# Patient Record
Sex: Female | Born: 1970 | Race: Black or African American | Hispanic: No | Marital: Married | Smoking: Never smoker
Health system: Southern US, Community
[De-identification: ages and names within clinical notes are randomized; demographics above are authoritative.]

## PROBLEM LIST (undated history)

## (undated) DIAGNOSIS — I10 Essential (primary) hypertension: Secondary | ICD-10-CM

## (undated) DIAGNOSIS — N393 Stress incontinence (female) (male): Secondary | ICD-10-CM

## (undated) DIAGNOSIS — K219 Gastro-esophageal reflux disease without esophagitis: Secondary | ICD-10-CM

## (undated) HISTORY — PX: NASAL SINUS SURGERY: SHX719

---

## 1999-07-14 ENCOUNTER — Other Ambulatory Visit: Admission: RE | Admit: 1999-07-14 | Discharge: 1999-07-14 | Payer: Self-pay | Admitting: *Deleted

## 2000-11-08 ENCOUNTER — Other Ambulatory Visit: Admission: RE | Admit: 2000-11-08 | Discharge: 2000-11-08 | Payer: Self-pay | Admitting: Internal Medicine

## 2002-02-16 HISTORY — PX: TUBAL LIGATION: SHX77

## 2002-04-06 ENCOUNTER — Other Ambulatory Visit: Admission: RE | Admit: 2002-04-06 | Discharge: 2002-04-06 | Payer: Self-pay | Admitting: Obstetrics and Gynecology

## 2002-04-06 ENCOUNTER — Other Ambulatory Visit: Admission: RE | Admit: 2002-04-06 | Discharge: 2002-04-06 | Payer: Self-pay | Admitting: *Deleted

## 2002-07-13 ENCOUNTER — Encounter: Payer: Self-pay | Admitting: Obstetrics and Gynecology

## 2002-07-13 ENCOUNTER — Ambulatory Visit (HOSPITAL_COMMUNITY): Admission: RE | Admit: 2002-07-13 | Discharge: 2002-07-13 | Payer: Self-pay | Admitting: Obstetrics and Gynecology

## 2002-08-31 ENCOUNTER — Ambulatory Visit (HOSPITAL_COMMUNITY): Admission: RE | Admit: 2002-08-31 | Discharge: 2002-08-31 | Payer: Self-pay | Admitting: Obstetrics and Gynecology

## 2002-08-31 ENCOUNTER — Encounter: Payer: Self-pay | Admitting: Obstetrics and Gynecology

## 2002-11-11 ENCOUNTER — Inpatient Hospital Stay (HOSPITAL_COMMUNITY): Admission: AD | Admit: 2002-11-11 | Discharge: 2002-11-13 | Payer: Self-pay | Admitting: Obstetrics and Gynecology

## 2002-11-11 ENCOUNTER — Encounter (INDEPENDENT_AMBULATORY_CARE_PROVIDER_SITE_OTHER): Payer: Self-pay | Admitting: Specialist

## 2002-11-12 ENCOUNTER — Encounter (INDEPENDENT_AMBULATORY_CARE_PROVIDER_SITE_OTHER): Payer: Self-pay | Admitting: Specialist

## 2003-11-19 ENCOUNTER — Encounter: Admission: RE | Admit: 2003-11-19 | Discharge: 2003-11-19 | Payer: Self-pay | Admitting: Family Medicine

## 2004-02-17 HISTORY — PX: CHOLECYSTECTOMY: SHX55

## 2004-04-08 ENCOUNTER — Ambulatory Visit: Payer: Self-pay | Admitting: Family Medicine

## 2004-08-08 ENCOUNTER — Other Ambulatory Visit: Admission: RE | Admit: 2004-08-08 | Discharge: 2004-08-08 | Payer: Self-pay | Admitting: Obstetrics and Gynecology

## 2004-10-14 ENCOUNTER — Emergency Department (HOSPITAL_COMMUNITY): Admission: EM | Admit: 2004-10-14 | Discharge: 2004-10-14 | Payer: Self-pay | Admitting: Family Medicine

## 2004-10-17 ENCOUNTER — Ambulatory Visit: Payer: Self-pay | Admitting: Internal Medicine

## 2004-10-30 ENCOUNTER — Ambulatory Visit: Payer: Self-pay | Admitting: Internal Medicine

## 2004-11-16 ENCOUNTER — Emergency Department (HOSPITAL_COMMUNITY): Admission: EM | Admit: 2004-11-16 | Discharge: 2004-11-16 | Payer: Self-pay | Admitting: Emergency Medicine

## 2004-12-18 ENCOUNTER — Ambulatory Visit: Payer: Self-pay | Admitting: Internal Medicine

## 2005-01-12 ENCOUNTER — Encounter (INDEPENDENT_AMBULATORY_CARE_PROVIDER_SITE_OTHER): Payer: Self-pay | Admitting: *Deleted

## 2005-01-12 ENCOUNTER — Ambulatory Visit (HOSPITAL_COMMUNITY): Admission: RE | Admit: 2005-01-12 | Discharge: 2005-01-12 | Payer: Self-pay | Admitting: General Surgery

## 2005-01-30 ENCOUNTER — Ambulatory Visit: Payer: Self-pay | Admitting: Internal Medicine

## 2005-03-11 ENCOUNTER — Ambulatory Visit: Payer: Self-pay | Admitting: Internal Medicine

## 2005-09-11 ENCOUNTER — Ambulatory Visit: Payer: Self-pay | Admitting: Internal Medicine

## 2006-02-10 ENCOUNTER — Ambulatory Visit: Payer: Self-pay | Admitting: Internal Medicine

## 2006-03-01 ENCOUNTER — Ambulatory Visit: Payer: Self-pay | Admitting: Internal Medicine

## 2006-04-02 ENCOUNTER — Emergency Department (HOSPITAL_COMMUNITY): Admission: EM | Admit: 2006-04-02 | Discharge: 2006-04-02 | Payer: Self-pay | Admitting: Family Medicine

## 2006-05-08 ENCOUNTER — Emergency Department (HOSPITAL_COMMUNITY): Admission: EM | Admit: 2006-05-08 | Discharge: 2006-05-08 | Payer: Self-pay | Admitting: Family Medicine

## 2006-06-18 ENCOUNTER — Encounter: Admission: RE | Admit: 2006-06-18 | Discharge: 2006-06-18 | Payer: Self-pay | Admitting: Family Medicine

## 2006-08-20 ENCOUNTER — Emergency Department (HOSPITAL_COMMUNITY): Admission: EM | Admit: 2006-08-20 | Discharge: 2006-08-20 | Payer: Self-pay | Admitting: Family Medicine

## 2006-10-29 ENCOUNTER — Ambulatory Visit: Payer: Self-pay | Admitting: Internal Medicine

## 2006-12-02 ENCOUNTER — Ambulatory Visit: Payer: Self-pay | Admitting: Internal Medicine

## 2006-12-02 LAB — PULMONARY FUNCTION TEST

## 2007-01-10 ENCOUNTER — Ambulatory Visit: Payer: Self-pay | Admitting: Internal Medicine

## 2007-03-10 ENCOUNTER — Ambulatory Visit: Payer: Self-pay | Admitting: Internal Medicine

## 2007-03-10 DIAGNOSIS — J45909 Unspecified asthma, uncomplicated: Secondary | ICD-10-CM | POA: Insufficient documentation

## 2007-04-20 ENCOUNTER — Ambulatory Visit: Payer: Self-pay | Admitting: Internal Medicine

## 2007-04-20 DIAGNOSIS — J309 Allergic rhinitis, unspecified: Secondary | ICD-10-CM | POA: Insufficient documentation

## 2007-04-20 DIAGNOSIS — J209 Acute bronchitis, unspecified: Secondary | ICD-10-CM

## 2007-04-29 ENCOUNTER — Encounter: Admission: RE | Admit: 2007-04-29 | Discharge: 2007-04-29 | Payer: Self-pay | Admitting: Obstetrics and Gynecology

## 2007-05-10 ENCOUNTER — Ambulatory Visit: Payer: Self-pay | Admitting: Internal Medicine

## 2007-05-11 ENCOUNTER — Encounter: Admission: RE | Admit: 2007-05-11 | Discharge: 2007-05-11 | Payer: Self-pay | Admitting: Obstetrics and Gynecology

## 2007-08-24 ENCOUNTER — Ambulatory Visit: Payer: Self-pay | Admitting: Internal Medicine

## 2007-09-04 ENCOUNTER — Emergency Department (HOSPITAL_COMMUNITY): Admission: EM | Admit: 2007-09-04 | Discharge: 2007-09-04 | Payer: Self-pay | Admitting: Family Medicine

## 2008-09-03 ENCOUNTER — Other Ambulatory Visit: Admission: RE | Admit: 2008-09-03 | Discharge: 2008-09-03 | Payer: Self-pay | Admitting: Family Medicine

## 2008-09-26 ENCOUNTER — Ambulatory Visit: Payer: Self-pay | Admitting: Family Medicine

## 2009-02-20 ENCOUNTER — Emergency Department (HOSPITAL_COMMUNITY): Admission: EM | Admit: 2009-02-20 | Discharge: 2009-02-20 | Payer: Self-pay | Admitting: Family Medicine

## 2009-02-20 ENCOUNTER — Telehealth (INDEPENDENT_AMBULATORY_CARE_PROVIDER_SITE_OTHER): Payer: Self-pay | Admitting: *Deleted

## 2009-06-06 ENCOUNTER — Ambulatory Visit: Payer: Self-pay | Admitting: Family Medicine

## 2009-06-06 LAB — CONVERTED CEMR LAB: Rapid Strep: NEGATIVE

## 2009-06-11 ENCOUNTER — Telehealth (INDEPENDENT_AMBULATORY_CARE_PROVIDER_SITE_OTHER): Payer: Self-pay | Admitting: *Deleted

## 2009-07-04 ENCOUNTER — Encounter: Admission: RE | Admit: 2009-07-04 | Discharge: 2009-07-04 | Payer: Self-pay | Admitting: Pulmonary Disease

## 2009-08-20 ENCOUNTER — Ambulatory Visit: Payer: Self-pay | Admitting: Family Medicine

## 2009-08-20 ENCOUNTER — Emergency Department (HOSPITAL_COMMUNITY): Admission: EM | Admit: 2009-08-20 | Discharge: 2009-08-20 | Payer: Self-pay | Admitting: Family Medicine

## 2009-09-19 ENCOUNTER — Other Ambulatory Visit: Admission: RE | Admit: 2009-09-19 | Discharge: 2009-09-19 | Payer: Self-pay | Admitting: Family Medicine

## 2009-09-27 ENCOUNTER — Encounter: Admission: RE | Admit: 2009-09-27 | Discharge: 2009-09-27 | Payer: Self-pay | Admitting: Family Medicine

## 2009-10-01 ENCOUNTER — Encounter: Admission: RE | Admit: 2009-10-01 | Discharge: 2009-11-14 | Payer: Self-pay | Admitting: Family Medicine

## 2009-10-07 ENCOUNTER — Ambulatory Visit: Payer: Self-pay | Admitting: Internal Medicine

## 2010-03-09 ENCOUNTER — Encounter: Payer: Self-pay | Admitting: Allergy and Immunology

## 2010-03-09 ENCOUNTER — Encounter: Payer: Self-pay | Admitting: Family Medicine

## 2010-03-18 NOTE — Assessment & Plan Note (Signed)
Summary: sore throat/chills/kdc   Vital Signs:  Patient profile:   40 year old female Weight:      234 pounds O2 Sat:      100 % on Room air Temp:     99.9 degrees F oral Pulse rate:   98 / minute BP sitting:   116 / 74  (left arm)  Vitals Entered By: Doristine Devoid (June 06, 2009 2:57 PM)  O2 Flow:  Room air CC: ST, fever and chills- lots of congestion    History of Present Illness: 40 yo woman here today for ST and fever.  also c/o congestion.  first started a few days ago.  using tylenol w/out relief.  + body aches.  no cough.  no facial pain or pressure.  no known sick contacts.  temp to 100.1.  has not been using her allergy meds.  Current Medications (verified): 1)  Symbicort 160-4.5 Mcg/act  Aero (Budesonide-Formoterol Fumarate) .... Two Puffs Twice Daily 2)  Hydrochlorothiazide 12.5 Mg  Tabs (Hydrochlorothiazide) .... Once Daily 3)  Zyrtec Allergy 10 Mg  Tabs (Cetirizine Hcl) .... As Needed 4)  Protonix 40 Mg Tbec (Pantoprazole Sodium) .... Once Daily 5)  Nasonex 50 Mcg/act Susp (Mometasone Furoate) .... 2 Sprays Each Nostril Once Daily  Allergies (verified): 1)  ! Sulfa 2)  ! Darvocet  Review of Systems      See HPI  Physical Exam  General:  overwt, NAD Head:  no TTP over sinuses Eyes:  no injxn or inflammation Ears:  External ear exam shows no significant lesions or deformities.  Otoscopic examination reveals clear canals, tympanic membranes are intact bilaterally without bulging, retraction, inflammation or discharge. Hearing is grossly normal bilaterally. Nose:  markedly edematous turbinates w/ congestion Mouth:  +PND Neck:  No deformities, masses, or tenderness noted. Lungs:  Normal respiratory effort, chest expands symmetrically. Lungs are clear to auscultation, no crackles or wheezes. Heart:  reg S1/S2   Impression & Recommendations:  Problem # 1:  SORE THROAT (ICD-462) Assessment New pt's sxs likely viral/allergy combo.  encouraged pt to restart  allergy meds to decrease congestion and PND.  reviewed supportive care and red flags that should prompt return.  Pt expresses understanding and is in agreement w/ this plan. Orders: Rapid Strep ([id number])  Complete Medication List: 1)  Symbicort 160-4.5 Mcg/act Aero (Budesonide-formoterol fumarate) .... Two puffs twice daily 2)  Hydrochlorothiazide 12.5 Mg Tabs (Hydrochlorothiazide) .... Once daily 3)  Zyrtec Allergy 10 Mg Tabs (Cetirizine hcl) .... As needed 4)  Protonix 40 Mg Tbec (Pantoprazole sodium) .... Once daily 5)  Nasonex 50 Mcg/act Susp (Mometasone furoate) .... 2 sprays each nostril once daily  Patient Instructions: 1)  This appears to be a virus and should improve with time (usually they last 7-10 days) 2)  Drink plenty of fluids 3)  Ibuprofen alternating w/ tylenol every 4 hours for pain and/or fever 4)  Sudafed as needed for congestion 5)  Restart your nasonex and zyrtec 6)  Call with any questions or concerns- or if not improving 7)  Hang in there! 8)  Happy Odis Luster!  Laboratory Results    Other Tests  Rapid Strep: negative  Kit Test Internal QC: Positive   (Normal Range: Negative)

## 2010-03-18 NOTE — Progress Notes (Signed)
  Phone Note Call from Patient   Caller: Patient Summary of Call: pt called c/o UTI offered appt tomorrow, pt refused will go to [facility] today  Initial call taken by: Kandice Hams,  February 20, 2009 1:58 PM

## 2010-03-18 NOTE — Assessment & Plan Note (Signed)
Summary: asthma flare-up//lch   Vital Signs:  Patient profile:   40 year old female Weight:      230.25 pounds O2 Sat:      96 % on Room air Temp:     98.5 degrees F oral Pulse rate:   81 / minute Pulse rhythm:   regular BP sitting:   130 / 86  (left arm) Cuff size:   large  Vitals Entered By: Army Fossa CMA (October 07, 2009 11:05 AM)  O2 Flow:  Room air CC: Head/ chest congestion Comments - coughing/whezzing - x 1 1/2 weeks - has tried Advil Sinus/allergy   History of Present Illness: asthma symptoms exacerbated for the last 3 or 4 days The main symptom is nasal congestion and yellow nasal discharge mild  cough Some wheezing  ROS No fever Mild shortness of breath, no chest pain No itchy nose, eyes are slightly watery  Current Medications (verified): 1)  Symbicort 160-4.5 Mcg/act  Aero (Budesonide-Formoterol Fumarate) .... Two Puffs Twice Daily 2)  Hydrochlorothiazide 12.5 Mg  Tabs (Hydrochlorothiazide) .... Once Daily 3)  Protonix 40 Mg Tbec (Pantoprazole Sodium) .... Once Daily 4)  Nasonex 50 Mcg/act Susp (Mometasone Furoate) .... 2 Sprays Each Nostril Once Daily 5)  Proair Hfa 108 (90 Base) Mcg/act Aers (Albuterol Sulfate) .... 2 Puffs Qid As Needed 6)  Tussionex Pennkinetic Er 8-10 Mg/35ml Lqcr (Chlorpheniramine-Hydrocodone) .Marland Kitchen.. 1tsp By Mouth At Bedtime As Needed  Allergies: 1)  ! Sulfa 2)  ! 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   Head:  face symmetric Ears:  R ear normal and L ear normal.   Nose:  quite congested Mouth:  no redness or discharge Lungs:  end expiratory wheezing, no rhonchi, no increased work of breathing Heart:  normal rate and no murmur.     Impression &  Recommendations:  Problem # 1:  ASTHMA, MODERATE (ICD-493.90) history of asthma, has flareups every  4 months. In between flareups, she uses albuterol  rarely Current exacerbation likely from a URI, early sinusitis ?  Plan, see instructions addendum: request prednisone, will Rx a low dose   Her updated medication list for this problem includes:    Symbicort 160-4.5 Mcg/act Aero (Budesonide-formoterol fumarate) .Marland Kitchen..Marland Kitchen Two puffs twice daily    Proair Hfa 108 (90 Base) Mcg/act Aers (Albuterol sulfate) .Marland Kitchen... 2 puffs qid as needed    Prednisone 20 Mg Tabs (Prednisone) .Marland Kitchen... 1 by mouth once daily x 5 days  Problem # 2:  ALLERGIC RHINITIS (ICD-477.9) She has not been using Nasonex as prescribed    Will give samples and a prescription for veramyst She was taking Zyrtec at some point, will advise to restart   Her updated medication list for this problem includes:    Nasonex 50 Mcg/act Susp (Mometasone furoate) .Marland Kitchen... 2 sprays each nostril once daily    Zyrtec Allergy 10 Mg Tabs (Cetirizine hcl) ..... One by mouth daily as needed for allergies    Veramyst 27.5 Mcg/spray Susp (Fluticasone furoate) .Marland Kitchen... 2 sprays in each side of the nose daily  Complete Medication List: 1)  Symbicort 160-4.5 Mcg/act Aero (Budesonide-formoterol fumarate) .... Two puffs twice daily 2)  Proair Hfa 108 (90 Base) Mcg/act Aers (Albuterol sulfate) .... 2 puffs qid as needed 3)  Hydrochlorothiazide 12.5 Mg Tabs (  Hydrochlorothiazide) .... Once daily 4)  Protonix 40 Mg Tbec (Pantoprazole sodium) .... Once daily 5)  Nasonex 50 Mcg/act Susp (Mometasone furoate) .... 2 sprays each nostril once daily 6)  Tussionex Pennkinetic Er 8-10 Mg/54ml Lqcr (Chlorpheniramine-hydrocodone) .Marland Kitchen.. 1tsp by mouth at bedtime as needed 7)  Zyrtec Allergy 10 Mg Tabs (Cetirizine hcl) .... One by mouth daily as needed for allergies 8)  Amoxicillin 500 Mg Caps (Amoxicillin) .... 2 capsules twice a day for one week 9)  Veramyst 27.5 Mcg/spray Susp  (Fluticasone furoate) .... 2 sprays in each side of the nose daily 10)  Prednisone 20 Mg Tabs (Prednisone) .Marland Kitchen.. 1 by mouth once daily x 5 days  Patient Instructions: 1)  rest, fluids, Tylenol 2)  Restart a nasal steroid, try Varamyst 3)  restartZyrtec over-the-counter 4)  Mucinex DM twice a day until better 5)  Amoxicillin for one week 6)  call if not better in a few days Prescriptions: PREDNISONE 20 MG TABS (PREDNISONE) 1 by mouth once daily x 5 days  #5 x 0   Entered and Authorized by:   Nolon Rod. Paz MD   Signed by:   Nolon Rod. Paz MD on 10/07/2009   Method used:   Print then Give to Patient   RxID:   [id number] VERAMYST 27.5 MCG/SPRAY SUSP (FLUTICASONE FUROATE) 2 sprays in each side of the nose daily  #1 x 3   Entered and Authorized by:   Nolon Rod. Paz MD   Signed by:   Nolon Rod. Paz MD on 10/07/2009   Method used:   Print then Give to Patient   RxID:   [id number] AMOXICILLIN 500 MG CAPS (AMOXICILLIN) 2 capsules twice a day for one week  #28 x 0   Entered and Authorized by:   Nolon Rod. Paz MD   Signed by:   Nolon Rod. Paz MD on 10/07/2009   Method used:   Print then Give to Patient   RxID:   [id number]

## 2010-03-18 NOTE — Progress Notes (Signed)
Summary: seen 06/06/09 no better  Phone Note Call from Patient Call back at Home Phone [phone]   Caller: Patient Summary of Call: Seen fri 06/06/09 throat is still sore, white spots on side of tonsils, low grade fever over weekend. Coughing last night. Pt using Ibuprofen, tylenol, nasonex. [facility] .Kandice Hams  June 11, 2009 8:58 AM  Initial call taken by: Kandice Hams,  June 11, 2009 8:58 AM  Follow-up for Phone Call        pt was informed at time of visit that this can last 7-10 days.  abx will not help a viral illness.  her strep test was negative.  lots of viruses can cause severe sore throat w/ white spots but abx only treat strep.  pt should continue the tylenol/ibuprofen for pain relief, plenty of fluids and if she is still concerned she can return for a repeat strep test. Follow-up by: Neena Rhymes MD,  June 11, 2009 9:05 AM  Additional Follow-up for Phone Call Additional follow up Details #1::        PT INFORMED OF DR Beverely Low RECOMMENDATIONS .Kandice Hams  June 11, 2009 9:41 AM  Additional Follow-up by: Kandice Hams,  June 11, 2009 9:41 AM

## 2010-03-18 NOTE — Assessment & Plan Note (Signed)
Summary: congested/ cbs   Vital Signs:  Patient profile:   40 year old female Height:      67 inches (170.18 cm) Weight:      229.25 pounds (104.20 kg) BMI:     36.04 O2 Sat:      96 % on Room air Temp:     98.2 degrees F (36.78 degrees C) oral Pulse rate:   79 / minute BP sitting:   124 / 76  (right arm) Cuff size:   large  Vitals Entered By: Lucious Groves (August 20, 2009 4:22 PM)  O2 Flow:  Room air CC: C/O asthma flare since yesterday and congestion x1.5 weeks./kb, URI symptoms Pain Assessment Patient in pain? no      Comments Patient notes that she has had yellow mucous producing cough, but denies fever, HA, and sore throat./kb   History of Present Illness:       This is a 40 year old woman who presents with URI symptoms.  The symptoms began 2 weeks ago.  Pt taking sudafed and advil sinus and allergy.  Her asthma started to act up yesterday.  The patient complains of nasal congestion, purulent nasal discharge, and productive cough, but denies earache and sick contacts.  Associated symptoms include dyspnea and wheezing.  The patient denies fever, low-grade fever (<100.5 degrees), fever of 100.5-103 degrees, fever of 103.1-104 degrees, fever to >104 degrees, stiff neck, rash, vomiting, diarrhea, use of an antipyretic, and response to antipyretic.  The patient also reports headache.  The patient denies itchy watery eyes, itchy throat, sneezing, seasonal symptoms, response to antihistamine, muscle aches, and severe fatigue.  The patient denies the following risk factors for Strep sinusitis: unilateral facial pain, unilateral nasal discharge, poor response to decongestant, double sickening, tooth pain, Strep exposure, tender adenopathy, and absence of cough.    Current Medications (verified): 1)  Symbicort 160-4.5 Mcg/act  Aero (Budesonide-Formoterol Fumarate) .... Two Puffs Twice Daily 2)  Hydrochlorothiazide 12.5 Mg  Tabs (Hydrochlorothiazide) .... Once Daily 3)  Zyrtec Allergy 10 Mg   Tabs (Cetirizine Hcl) .... As Needed 4)  Protonix 40 Mg Tbec (Pantoprazole Sodium) .... Once Daily 5)  Nasonex 50 Mcg/act Susp (Mometasone Furoate) .... 2 Sprays Each Nostril Once Daily  Allergies (verified): 1)  ! Sulfa 2)  ! Darvocet  Past History:  Past medical, surgical, family and social histories (including risk factors) reviewed for relevance to current acute and chronic problems.  Past Medical History: Reviewed history from 09/26/2008 and no changes required. ASTHMA, MODERATE (ICD-493.90)  Mild obesity seasonal allergies  Family History: Reviewed history from 03/10/2007 and no changes required. adopted and does not know biologic parents  Social History: Reviewed history from 08/24/2007 and no changes required. Patient states former smoker.  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  Otoscopic examination reveals clear canals, tympanic membranes are intact bilaterally without bulging, retraction, inflammation or discharge. Hearing is grossly normal bilaterally. Nose:  L frontal sinus tenderness, L maxillary sinus tenderness, R frontal sinus tenderness, and R maxillary sinus tenderness.   Mouth:  Oral mucosa and oropharynx without lesions or exudates.  Teeth in good repair. Neck:  No deformities, masses, or tenderness noted. Lungs:  R wheezes and L wheezes.  Heart:  normal rate and no murmur.   Extremities:  No clubbing, cyanosis, edema, or deformity noted with normal full range of motion of all joints.   Psych:  Cognition and judgment appear intact. Alert and cooperative with normal attention span and concentration. No apparent delusions, illusions, hallucinations   Impression &  Recommendations:  Problem # 1:  ASTHMATIC BRONCHITIS, ACUTE (ICD-466.0)  Her updated medication list for this problem includes:    Symbicort 160-4.5 Mcg/act Aero (Budesonide-formoterol fumarate) .Marland Kitchen..Marland Kitchen Two puffs twice daily    Proair Hfa 108 (90 Base) Mcg/act Aers (Albuterol sulfate) .Marland Kitchen... 2 puffs qid as needed    Augmentin 875-125 Mg Tabs (Amoxicillin-pot clavulanate) .Marland Kitchen... 1 by mouth two times a day    Tussionex Pennkinetic Er 8-10 Mg/76ml Lqcr (Chlorpheniramine-hydrocodone) .Marland Kitchen... 1tsp by mouth at bedtime as needed  Take antibiotics and other medications as directed. Encouraged to push clear liquids, get enough rest, and take acetaminophen as needed. To be seen in 5-7 days if no improvement, sooner if worse.  Orders: Depo- Medrol 80mg  (J1040) Admin of Therapeutic Inj  intramuscular or subcutaneous (30865)  Problem # 2:  SINUSITIS - ACUTE-NOS (ICD-461.9)  Her updated medication list for this problem includes:    Nasonex 50 Mcg/act Susp (Mometasone furoate) .Marland Kitchen... 2 sprays each nostril once daily    Augmentin 875-125 Mg Tabs (Amoxicillin-pot clavulanate) .Marland Kitchen... 1 by mouth two times a day    Tussionex Pennkinetic Er 8-10 Mg/81ml Lqcr (Chlorpheniramine-hydrocodone) .Marland Kitchen... 1tsp by mouth at bedtime as needed  Instructed on treatment. Call if symptoms persist or worsen.   Complete Medication List: 1)  Symbicort 160-4.5 Mcg/act Aero (Budesonide-formoterol fumarate) .... Two puffs twice daily 2)  Hydrochlorothiazide 12.5 Mg Tabs (Hydrochlorothiazide) .... Once daily 3)  Zyrtec Allergy 10 Mg Tabs (Cetirizine hcl) .... As needed 4)  Protonix 40 Mg Tbec (Pantoprazole sodium) .... Once daily 5)  Nasonex 50 Mcg/act Susp (Mometasone furoate) .... 2 sprays each nostril once daily 6)  Proair Hfa 108 (90 Base) Mcg/act Aers (Albuterol sulfate) .... 2 puffs qid as needed 7)  Augmentin 875-125 Mg Tabs (Amoxicillin-pot clavulanate) .Marland Kitchen.. 1 by mouth two times a day 8)  Prednisone 10 Mg Tabs (Prednisone) .... 3  by mouth once daily for 3 days then 2 by mouth once daily for 2 days then 1 by mouth once daily for 3 days 9)  Tussionex Pennkinetic Er 8-10 Mg/82ml Lqcr (Chlorpheniramine-hydrocodone) .Marland Kitchen.. 1tsp by mouth at bedtime as needed Prescriptions: TUSSIONEX PENNKINETIC ER 8-10 MG/5ML LQCR (CHLORPHENIRAMINE-HYDROCODONE) 1tsp by mouth at bedtime as needed  #6 oz x 0   Entered and Authorized by:   Loreen Freud DO   Signed by:   Loreen Freud DO on 08/20/2009   Method used:   Print then Give to Patient   RxID:   [id number] PREDNISONE 10 MG TABS (PREDNISONE) 3 by mouth once daily for 3 days then 2 by mouth once daily for 2 days then 1 by mouth once daily for 3 days  #18 x 0   Entered and Authorized by:   Loreen Freud DO   Signed by:   Loreen Freud DO on 08/20/2009   Method used:   Electronically to        [id number]       [address]       [place], [place]  [address]       Ph: [phone] or [phone]       Fax: [phone]   RxID:   [id number] AUGMENTIN [id number]  MG TABS (AMOXICILLIN-POT CLAVULANATE) 1 by mouth two times a day  #20 x 0   Entered and Authorized by:   Loreen Freud DO   Signed by:   Loreen Freud DO on 08/20/2009   Method used:   Electronically to        [id number] ([facility])       [address]       [place], [place]  [address]       Ph: [phone] or [phone]       Fax: [phone]   RxID:   [id number]    Medication Administration  Injection # 1:    Medication: Depo- Medrol 80mg     Diagnosis: ASTHMATIC BRONCHITIS, ACUTE (ICD-466.0)    Route: IM    Site: LUOQ gluteus    Exp Date: 12/17/2009    Lot #: [id number]    Mfr: Pharmacia    Patient tolerated injection without complications    Given by: Lucious Groves (August 20, 2009 4:41 PM)  Orders Added: 1)  Est. Patient Level III [93235] 2)  Depo- Medrol 80mg  [J1040] 3)  Admin of Therapeutic Inj  intramuscular or subcutaneous [57322]

## 2010-05-04 LAB — POCT URINALYSIS DIP (DEVICE)
Bilirubin Urine: NEGATIVE
Glucose, UA: NEGATIVE mg/dL
Ketones, ur: NEGATIVE mg/dL
Nitrite: NEGATIVE
Protein, ur: 100 mg/dL — AB
Specific Gravity, Urine: 1.02 (ref 1.005–1.030)
Urobilinogen, UA: 0.2 mg/dL (ref 0.0–1.0)
pH: 6 (ref 5.0–8.0)

## 2010-05-04 LAB — POCT PREGNANCY, URINE: Preg Test, Ur: NEGATIVE

## 2010-07-01 NOTE — Assessment & Plan Note (Signed)
[facility]                             PULMONARY OFFICE NOTE   AURORA, Rachael Calderon                    MRN:          [id number]  DATE:01/10/2007                            DOB:          1970-08-11    HISTORY OF PRESENT ILLNESS:  The patient is a 40 year old African-  American female patient of Dr. Thurston Hole who has a known history of  moderate asthma with a fixed component that presents today for an acute  office visit.  The patient complains over the last three days she has  had increased cough, congestion, sinus pain and pressure and wheezing.  The patient denies any hemoptysis, orthopnea, PND or leg swelling.   PAST MEDICAL HISTORY:  Reviewed.   CURRENT MEDICATIONS:  Reviewed.   PHYSICAL EXAMINATION:  GENERAL APPEARANCE:  The patient is a pleasant  female in no acute distress.  She is afebrile with stable vital signs.  HEENT:  Nasal mucosa is erythematous. Maxillary sinus tenderness.  Posterior pharynx is clear.  NECK:  Supple without cervical adenopathy.  No jugular venous  distention.  LUNGS:  Sounds reveal coarse breath sounds with expiratory wheezes  bilaterally.  CARDIAC:  Regular rate.  ABDOMEN:  Soft, nontender.  EXTREMITIES:  Warm without any edema.   IMPRESSION:  Acute rhinosinusitis with asthmatic flare.   PLAN:  The patient is to begin Omnicef x10 days.  Nasal hygiene regimen  with saline and Afrin nasal spray.  Mucinex DM twice daily.  The patient  was given a prednisone taper to have on hold in case symptoms improve  and wheezing continues.  The patient will return back with Dr. Sherene Sires in  two weeks or sooner if needed.      Rubye Oaks, NP  Electronically Signed      Charlaine Dalton. Sherene Sires, MD, [facility]  Electronically Signed   TP/MedQ  DD: 01/11/2007  DT: 01/11/2007  Job #: [id number]

## 2010-07-01 NOTE — Assessment & Plan Note (Signed)
[facility]                             PULMONARY OFFICE NOTE   YUMI, INSALACO                    MRN:          [id number]  DATE:10/29/2006                            DOB:          08-Jan-1971    CHIEF COMPLAINT:  Uncontrolled asthma.   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  She has been under the  care of multiple allergists, most recently Dr. Lucie Leather, who found she was  allergic to cockroaches.  Treated her with Xolair for about 4-5 months  and apparently had no benefit because it was stopped in June.  She also  underwent sinus surgery for polyps in October, 2007.  She says nothing  every does any good, including most recently Symbicort at a dose of  160/4.5 b.i.d.  She remains symptomatic with frequent steroid injections  which transiently improves her condition but then relapses within a few  days of stopping them.  She also gets better from albuterol but only  for about four hours.  She comes in today actually having missed her  Symbicort morning dose and not used any albuterol at all.  She says this  is her typical day, no better or worse than usual.  She says stress  sometimes makes it worse.  She denies any pleuritic pain, sinus pain,  purulent fevers, chills, sweats, orthopnea, PND, or leg swelling.   PAST MEDICAL HISTORY:  Significant for hypertension, allergies, asthma,  and status post cholecystectomy.   ALLERGIES:  She read she could not take ASPIRIN but does not actually  know that she is allergic to aspirin.  She cannot take SULFA because of  a rash, DARVOCET because of a rash and dyspnea.   MEDICATIONS:  1. Symbicort 164.5 2 puffs b.i.d.  2. Veramyst 1 b.i.d.  3. Singulair 10 mg q.p.m.  4. Hydrochlorothiazide 12.5 mg 1 daily.  5. Prevacid 30 mg daily.   SOCIAL HISTORY:  She has never smoked.  Works as an Public house manager, as noted.   FAMILY HISTORY:  Unknown,  as she is adopted.   REVIEW OF SYSTEMS:  Taken in detail on the work sheet and negative  except as outlined above.   PHYSICAL EXAMINATION:  This is a moderately obese, pleasant, ambulatory  black female in no acute distress.  Afebrile with normal vital signs.  HEENT:  Mild turbinate edema with nonspecific features.  Watery nasal  discharge.  Oropharynx is clear with no excessive postnasal drainage or  cobblestoning.  NECK:  Supple without cervical adenopathy or tenderness.  Trachea is  midline.  No thyromegaly.  LUNGS:  Reveal more of a pseudowheeze than a true wheeze.  Air movement  is adequate.  HEART:  Regular rhythm without murmur, rub or gallop.  ABDOMEN:  Soft, benign.  EXTREMITIES:  Warm without calf tenderness, clubbing, cyanosis or edema.   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   I found out to my surprise she actually had very little insight in how  to use an inhaler effectively and probably inhaled less than 10% of the  medication.  This gave me hope that despite the fact that she is an LPN  and has been teaching other patients to use their inhalers.   This actually gave me hope that we can help her by first effectively  delivering the medication to the target tissue, and I think Symbicort is  an excellent choice for this in a patient with upper airway instability  (QVAR might be another option when she returns if not better).  For now,  I recommended Symbicort 164.5 2 puffs b.i.d., as she is now perfectly  regulated with Xopenex HFA 2 puffs every 4 hours p.r.n. (a sample given  of each).  I then reviewed with her  very carefully a GERD diet and asked  her to take Prevacid before her biggest meal of the day, consistently.  For cough, she can use Mucinex DM 1-2 q.12h., but I would like to avoid  cough medications in general and in particular, cough drops or anything  that has mint or menthol in it that might destabilize the upper airway.   Followup will be in four weeks for a set of PFTs.  We will see her  sooner if needed.     Charlaine Dalton. Sherene Sires, MD, [facility]  Electronically Signed    MBW/MedQ  DD: 10/29/2006  DT: 10/29/2006  Job #: [id number]   cc:   Pam Drown, M.D.

## 2010-07-01 NOTE — Assessment & Plan Note (Signed)
[facility]                             PULMONARY OFFICE NOTE   SHAMAR, KRACKE                    MRN:          [id number]  DATE:12/02/2006                            DOB:          [birth year]    HISTORY:  A 40 year old black female returns all smiles with no  pulmonary symptoms whatsoever since switching to Symbicort 160/4.5 two  puffs b.i.d. and is no longer using any form of rescue therapy.  She  also on her own stopped Veramyst and Singulair because she felt so  good.   PHYSICAL EXAMINATION:  She is a pleasant ambulatory black female in no  acute distress.  She is afebrile, normal vital signs.  HEENT:  Unremarkable, oropharynx clear.  LUNGS:  Fields completely clear bilaterally to auscultation and  percussion.  Regular rhythm without murmur, gallop, rub.  ABDOMEN:  Soft, benign.  EXTREMITIES:  Warm without calf tenderness, cyanosis, clubbing, edema.   Spirometry was preformed today, indicates an FEV1 of only 73% predicted  with a ratio of 61% and no improvement after bronchodilators.   IMPRESSION:  This patient has at least moderate asthma with a fixed  component that did not improve after Symbicort.  Interestingly it  totally eliminated her symptoms.  I suspect she has been chronically  undertreated due to nonadherence issues for years and recommended  therefore she maintain on Symbicort as monotherapy for at least 3  months before considering stepdown to lower strength Symbicort (an  argument could be made as long as she has evidence of residual airflow  obstruction to continue the higher doses).   I spent most of my time reviewing the history of her asthma, emphasizing  that compliance is really the key to maintaining control over her  asthma.  I have also reminded her that even though she has good control  now that she is subject to exacerbations and reviewed very carefully  with her the rule of twos regarding rescue therapy with  albuterol.   Followup can be in 3 months, sooner if needed.     Charlaine Dalton. Sherene Sires, MD, [facility]  Electronically Signed    MBW/MedQ  DD: 12/02/2006  DT: 12/03/2006  Job #: [id number]   cc:   Pam Drown, M.D.

## 2010-07-04 NOTE — H&P (Signed)
NAME:  Rachael Calderon, Rachael Calderon                       ACCOUNT NO.:  [id number]   MEDICAL RECORD NO.:  [id number]                   PATIENT TYPE:  INP   LOCATION:  9162                                 FACILITY:  [facility]   PHYSICIAN:  Janine Limbo, M.D.            DATE OF BIRTH:  12-17-1970   DATE OF ADMISSION:  11/11/2002  DATE OF DISCHARGE:                                HISTORY & PHYSICAL   HISTORY OF PRESENT ILLNESS:  Ms. Hams is a 40 year old, gravida 4, para 2-  0-1-2, at 41 weeks, who presents with uterine contractions every four  minutes with questionable yellow discharge.  She is scheduled for induction  on November 14, 2002, secondary to post dates.  The pregnancy has been  remarkable for:  1.  Positive group B Streptococcus.  2.  Two-vessel cord on  ultrasound.  3.  Conception on OCPs.  4.  Asthma.  5.  Desires tubal.   PRENATAL LABORATORY DATA:  Blood type is A positive.  Rh antibody negative.  VDRL nonreactive.  Rubella titer positive.  Hepatitis B surface antigen  negative.  HIV was declined.  GC and chlamydia were negative.  Pap was  normal.  Glucose challenge was normal.  The hemoglobin upon entry into  practice was 11.7.  It was 10.3 at 26 weeks.  AFP was declined.  Group B  Streptococcus culture was positive at 36 weeks.  Other cultures were  negative.  An EDC of November 04, 2002, was established by ultrasound in  the first trimester, as well as LMP.   HISTORY OF PRESENT PREGNANCY:  The patient entered care at approximately 10  weeks.  She had an ultrasound at approximately 11 weeks.  She declined quad  screen.  She had an ultrasound at 18 weeks that had limited visualization of  anatomy.  That was followed up at approximately 24 weeks showing a two-  vessel cord.  She had growth reevaluation at approximately 31 weeks showing  growth in the 50th-75th percentile and normal fluid.  She was placed on  Zyrtec at approximately 27 weeks for allergy symptoms.  The rest of  her  pregnancy was essentially uncomplicated.   OBSTETRICAL HISTORY:  In 1993, she had a first trimester TAB.  In 1997, she  had a vaginal birth of a female infant, 6 pounds 10 ounces, at 40 weeks.  She  was in labor 10 hours.  She had epidural anesthesia.  She had no  complications.  In 2000, she had a vaginal birth of a female infant, weight 6  pounds 5 ounces at 40 weeks.  She was in labor eight to nine hours.  She had  epidural anesthesia.  She had no complications.   PAST MEDICAL HISTORY:  1. She was on oral contraceptives in the past, but conceived on them at this     time.  2. She has occasional yeast infections.  3. She reports usual  childhood illnesses.  4. She has asthma.  Currently on medication and is well controlled.  5. She had nasal polyps removed in 1998.  6. Hospitalization for childbirth.   ALLERGIES:  She is allergic to SULFA which causes a rash.   FAMILY HISTORY:  The patient is adopted, but knows nothing about her  biological family.   GENETIC HISTORY:  Remarkable for the father of the baby having a niece with  Down syndrome.   SOCIAL HISTORY:  The patient is married to the father of the baby.  He is  involved and supportive.  His name is Caylei Sperry.  The patient has two  years of college.  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  The patient's  husband is college.  He is employed in trucking.  She has been followed by  the certified nurse midwife service at [facility].  She denies any  alcohol, drug, or tobacco use during this pregnancy.  She is [place]  [employer] and of the [employer].   PHYSICAL EXAMINATION:  VITAL SIGNS:  Stable.  The patient is afebrile.  HEENT:  Within normal limits.  LUNGS:  Breath sounds are clear.  HEART:  Regular rate and rhythm without murmur.  BREASTS:  Soft and nontender.  ABDOMEN:  The fundal height is approximately 38 cm.  The estimated fetal  weight is 7-8 pounds.  Uterine contractions are every 4 minutes, 60  seconds  in duration, and of mild to moderate quality.  Fetal heart rate is noted  with no decelerations.  PELVIC:  Cervical exam 2-3 cm, 75%, vertex, and a -1 to 0 station with the  vertex posterior.  Membranes are intact.  There is a small amount of mucusy  bloody show noted, but no evidence of spontaneous rupture of membranes.  EXTREMITIES:  Deep tendon reflexes are 2+ without clonus.  There is trace  edema noted.   IMPRESSION:  1. Intrauterine pregnancy at 41 weeks.  2. Early labor.  3. Positive group B Streptococcus .  4. Two-vessel cord.   PLAN:  1. Admit to birthing suite per consult with Dr. Stefano Gaul as attending     physician.  2. Routine certified nurse midwife orders.  3. Ampicillin 2 g IV q.6h. for group B Streptococcus .  4. Artificial rupture of membranes after antibiotic infused.  5. Postpartum tubal sterilization.  6. The patient is currently undecided regarding pain management preferences.     Renaldo Reel Emilee Hero, C.N.M.                   Janine Limbo, M.D.    VLL/MEDQ  D:  11/11/2002  T:  11/11/2002  Job:  [id number]

## 2010-07-04 NOTE — Discharge Summary (Signed)
NAME:  TERETHA, CHALUPA                       ACCOUNT NO.:  [id number]   MEDICAL RECORD NO.:  [id number]                   PATIENT TYPE:  INP   LOCATION:  9123                                 [facility]ILITY[facility]  [facility]   PHYSICIAN:  Janine Limbo, M.D.            DATE OF BIRTH:  [birth year]   DATE OF ADMISSION:  11/11/2002  DATE OF DISCHARGE:  11/13/2002                                 DISCHARGE SUMMARY   ADMISSION DIAGNOSES:  1. Intrauterine pregnancy at 41 weeks.  2. Early labor.  3. Group B Streptococcus positive.  4. Two-vessel cord.   DISCHARGE DIAGNOSES:  1. Intrauterine pregnancy at 41 weeks.  2. Early labor.  3. Group B Streptococcus positive.  4. Two-vessel cord.  5. Meconium stained amniotic fluid.  6. Status post spontaneous vaginal birth of a female infant named [place]     weighing 6 pounds 11 ounces, Apgar's 8 and 9.  7. Desires sterilization.  8. Status post postpartum bilateral tubal ligation.   PROCEDURES:  1. Epidural anesthesia.  2. Spontaneous vaginal delivery.  3. Low dose Pitocin augmentation.  4. Amnioinfusion.  5. Postpartum bilateral tubal ligation.   HOSPITAL COURSE:  The patient was admitted in early labor with a cervical  exam of 2-3 cm.  Ampicillin was begun for Group B Streptococcus prophylaxis.  Amniotomy was performed.  Her labor progressed throughout the evening and  meconium stained fluid was noted.  Amnioinfusion was begun and low dose  Pitocin augmentation was implemented during transition phase.  She proceed  to have a spontaneous vaginal birth of a female infant named [place], weighing  6 pounds 11 ounces with Apgar's 8 and 9.  Placenta was sent to pathology  secondary to a two-vessel cord.  The patient was desiring sterilization and  underwent a postpartum bilateral tubal ligation on postpartum day #1, under  epidural anesthesia with no complications.  On postpartum day #2, she was  stable.  Vital signs were within normal limits.  Cardiac was  regular rate  and rhythm.  Lungs were clear.  Abdomen was soft and appropriately tender.  Dressing was clean dry and intact with old drainage on the dressing.  Lochia  was small.  Extremities within normal limits and she was deemed to have  received full benefit of her hospital stay and was discharged home.   DISCHARGE MEDICATIONS:  1. Motrin 600 mg p.o. q.6h. p.r.n.  2. Tylox one to two p.o. q.4h. p.r.n.   DISCHARGE LABORATORY DATA AND X-RAY FINDINGS:  White blood cell count 9.4,  hemoglobin 10.6, platelet count 190.  RPR nonreactive.   SPECIAL INSTRUCTIONS:  Discharge instructions per [facility] handout.   FOLLOW UP:  Follow up in six weeks or p.r.n.      Marie L. Williams, C.N.M.                 Janine Limbo, M.D.    MLW/MEDQ  D:  11/13/2002  T:  11/13/2002  Job:  [id number]

## 2010-07-04 NOTE — Op Note (Signed)
NAMELOWANDA, CASHAW             ACCOUNT NO.:  [id number]   MEDICAL RECORD NO.:  [id number]          PATIENT TYPE:  AMB   LOCATION:  DAY                          FACILITY:  [facility]   PHYSICIAN:  Adolph Pollack, M.D.DATE OF BIRTH:  1970/11/11   DATE OF PROCEDURE:  01/12/2005  DATE OF DISCHARGE:                                 OPERATIVE REPORT   PREOPERATIVE DIAGNOSIS:  Symptomatic cholelithiasis.   POSTOPERATIVE DIAGNOSIS:  Symptomatic cholelithiasis.   PROCEDURE:  Laparoscopic cholecystectomy with intraoperative cholangiogram.   SURGEON:  Dr. Abbey Chatters   ASSISTANT:  Dr. Consuello Bossier   ANESTHESIA:  General   INDICATIONS:  This is a 40 year old female with intermittent back pain that  sometimes comes to the epigastric region.  She, however, had a significant  episode of biliary type colic and had an urgent evaluation.  Some elevation  of liver function tests were noted, and she had an ultrasound demonstrating  gallstones and a mildly dilated common bile duct.  Liver function tests just  about completely returned to normal except for her SGOT.  They are normal  today.  She now presents for elective laparoscopic cholecystectomy.  The  procedure and the risks have been discussed with her preoperatively.   TECHNIQUE:  She is seen in the holding area, then brought to the operating  room, placed supine on the operating table, and a general anesthetic was  administered.  The abdominal wall was sterilely prepped and draped.  She had  a previous small subumbilical incision.  Local anesthetic was infiltrated  deep to this.  The small transverse subumbilical incision was reincised,  carrying the incision through the skin and subcutaneous tissue.  The fascia  was grasped and a midline incision made in the fascia and the peritoneum,  entering the peritoneal cavity under direct vision.  A pursestring suture of  0 Vicryl was placed around the fascial edges.  A Hassan trocar was  introduced into the peritoneal cavity, and a pneumoperitoneum was created by  insufflation of CO2 gas.   Next, a laparoscope was introduced into the abdominal cavity.  She was then  placed in reverse Trendelenburg position with the right side tilted slightly  upward.  A 10 mm trocar was placed through an epigastric incision and two 5  mm trocars placed in the right upper quadrant.  The fundus of the  gallbladder was grasped, and it was able to be retracted toward the right  shoulder.  No significant adhesions were noted.  The infundibulum was  grasped and using dissection close to the gallbladder, it was mobilized.  I  identified the cystic duct and the cystic artery.  I created a window around  each of these with blunt dissection.  A clip was placed at the cystic duct  gallbladder junction and a small incision made in the cystic duct.  A  cholangiocath was passed through the anterior abdominal wall, placed in the  cystic duct, and the cholangiogram was performed.   Under real time fluoroscopy, contrast was injected into the cystic duct  which was of long length.  The common hepatic, right and  left hepatic, and  common bile duct was all opacified, and contrast drained rapidly into the  duodenum without obvious evidence of obstruction.  The final report is  pending the radiologist's interpretation.  There was a little bit of  extravasation noted where the catheter went to the cystic duct.   Following this, I removed the cholangiocatheter.  I clipped the cystic duct  3 times proximally and divided.  The cystic artery was then clipped and  divided.  I then dissected the gallbladder free from the liver bed using  electrocautery.  There were a few areas which were suspicious for being a  small accessory duct going directly from liver in the gallbladder, and I  clipped this on the staying-in-side.  Once the gallbladder was dissected  free from liver bed, it was placed in an Endopouch bag.   Bleeding points in  the gallbladder fossa were controlled with electrocautery.  I irrigated out  the gallbladder fossa and inspected it, and no bleeding was noted; no bile  leak was noted.  I then evacuated the irrigation fluid.   The gallbladder was removed through the subumbilical port in the Endopouch  bag and a subumbilical fascial defect was closed by tightening up and tying  down the pursestring suture.  Small adhesions between the omentum and lower  abdominal wall were then lysed sharply.  The remaining irrigation fluid was  evacuated.  The remaining trocars were then removed, and the  pneumoperitoneum was released.  The skin incisions were closed with 4-0  Monocryl subcuticular stitches followed by Steri-Strips and sterile  dressings.   She tolerated the procedure well without any apparent complications and was  taken to the recovery room in satisfactory condition.      Adolph Pollack, M.D.  Electronically Signed     TJR/MEDQ  D:  01/12/2005  T:  01/12/2005  Job:  [id number]   cc:   Wanda Plump, MD [facility]  [phone] [address] [place], [place] [address]

## 2010-07-04 NOTE — Op Note (Signed)
NAME:  Rachael Calderon, Rachael Calderon                       ACCOUNT NO.:  [id number]   MEDICAL RECORD NO.:  [id number]                   PATIENT TYPE:  INP   LOCATION:  9123                                 [facility]ILITY[facility]   PHYSICIAN:  Janine Limbo, M.D.            DATE OF BIRTH:  September 26, 1970   DATE OF PROCEDURE:  11/12/2002  DATE OF DISCHARGE:                                 OPERATIVE REPORT   PREOPERATIVE DIAGNOSES:  1. Postpartum day #1.  2. Desires sterilization.   POSTOPERATIVE DIAGNOSES:  1. Postpartum day #1.  2. Desires sterilization.   PROCEDURE:  Postpartum bilateral tubal ligation.   SURGEON:  Janine Limbo, M.D.   ANESTHESIA:  Epidural.   DISPOSITION:  [name] is a 40 year old female, now para 3-0-1-3, who is  postpartum day #1 from a vaginal delivery of a healthy female infant.  She  desires permanent sterilization.  She understands the indications for her  procedure and she accepts the risks of, but not limited to, anesthetic  complications, bleeding, infections, and possible damage to the surrounding  organs.   FINDINGS:  The patient had normal fallopian tubes bilaterally.   DESCRIPTION OF PROCEDURE:  The patient was taken to the operating room,  where it was determined that the epidural she had received for labor would  be adequate for a tubal ligation.  The patient's abdomen was prepped with  multiple layers of Betadine.  She was sterilely draped.  When it was  determined that her anesthetic was adequate, her subumbilical area was  injected with 6 mL of 0.5% Marcaine with epinephrine.  A subumbilical  incision was made and carried sharply through the subcutaneous tissue, the  fascia, and the anterior peritoneum.  The left fallopian tube was identified  and followed to its fimbriated end.  A knuckle of tube was made on the left  using a free tie and then a suture ligature of 0 plain catgut.  The knuckle  of tube thus made was excised.  Hemostasis was  adequate.  An identical  procedure was carried out on the opposite side.  Again hemostasis was  adequate.  All instruments were removed.  The fascia and the anterior  peritoneum were closed using a running suture of 2-0 Vicryl.  The skin was  reapproximated using a subcuticular suture of 4-0 Vicryl.  Sponge, needle,  and instrument counts were correct on two occasions.  The estimated blood  loss was 10 mL.  The patient tolerated her procedure well.  She was taken to  the recovery room in stable condition.                                               Janine Limbo, M.D.    AVS/MEDQ  D:  11/12/2002  T:  11/13/2002  Job:  [id number]

## 2010-08-28 ENCOUNTER — Other Ambulatory Visit: Payer: Self-pay | Admitting: Family Medicine

## 2010-08-28 DIAGNOSIS — Z1231 Encounter for screening mammogram for malignant neoplasm of breast: Secondary | ICD-10-CM

## 2010-09-05 ENCOUNTER — Ambulatory Visit
Admission: RE | Admit: 2010-09-05 | Discharge: 2010-09-05 | Disposition: A | Discharge status: Home / Self Care | Payer: PRIVATE HEALTH INSURANCE | Source: Ambulatory Visit | Attending: Family Medicine | Admitting: Family Medicine

## 2010-09-05 DIAGNOSIS — Z1231 Encounter for screening mammogram for malignant neoplasm of breast: Secondary | ICD-10-CM

## 2011-01-01 ENCOUNTER — Encounter (HOSPITAL_BASED_OUTPATIENT_CLINIC_OR_DEPARTMENT_OTHER): Payer: Self-pay | Admitting: *Deleted

## 2011-01-01 NOTE — Progress Notes (Signed)
NPO AFTER MN. PT TO ARRIVE AT 0615. NEEDS ISTAT. WILL DO SYMBICORT INHALER AM OF SURG. W/ SIP OF WATER.

## 2011-01-04 NOTE — H&P (Signed)
History of Present Illness     Rachael Calderon is a 40 year old female patient who is self-referred for further evaluation of incontinence. She reports this is been present for several years. She has delivered 3 children vaginally. Her incontinence is worsening over time. It occurs with coughing and laughing. She states that she used to be able to hold her urine when she laughed but now she's unable to do so. This requires her to wear a protective pad daily. She denies difficulty with UTIs. She otherwise has no voiding complaints and specifically denies any urgency or nocturia. She reports that about a year ago she had a study that she believes was urodynamics after which she underwent physical therapy but did not note improvement in her condition.   Past Medical History Problems  1. History of  Asthma 493.90 2. History of  Esophageal Reflux 530.81 3. History of  Hypertension 401.9  Surgical History Problems  1. History of  Cholecystectomy 2. History of  Sinus Surgery  Current Meds 1. Hydrochlorothiazide 25 MG Oral Tablet; Therapy: (Recorded:15May2012) to 2. Protonix 40 MG Oral Tablet Delayed Release; Therapy: (Recorded:15May2012) to 3. Symbicort 160-4.5 MCG/ACT Inhalation Aerosol; Therapy: (Recorded:15May2012) to  Allergies Medication  1. Sulfur  Social History Problems    Caffeine Use 2-4   Marital History - Currently Married   Never A Smoker Denied    History of  Alcohol Use  Review of Systems Genitourinary, constitutional, skin, eye, otolaryngeal, hematologic/lymphatic, cardiovascular, pulmonary, endocrine, musculoskeletal, gastrointestinal, neurological and psychiatric system(s) were reviewed and pertinent findings if present are noted.  Genitourinary: urinary frequency and incontinence.  ENT: sinus problems.  Respiratory: shortness of breath.    Vitals Vital Signs [Data Includes: Last 1 Day]  15May2012 08:45AM  BMI Calculated: 34.53 BSA Calculated: 2.1 Height: 5 ft 7  in Weight: 220 lb  Blood Pressure: 149 / 87 Temperature: 98.4 F Heart Rate: 73 Respiration: 16   But wasn't nauseated he   Physical Exam Constitutional: Well nourished and well developed . No acute distress.  ENT:. The ears and nose are normal in appearance.  Neck: The appearance of the neck is normal and no neck mass is present.  Pulmonary: No respiratory distress and normal respiratory rhythm and effort.  Cardiovascular: Heart rate and rhythm are normal . No peripheral edema.  Abdomen: The abdomen is soft and nontender. No masses are palpated. No CVA tenderness. No hernias are palpable. No hepatosplenomegaly noted.  Genitourinary: Examination of the external genitalia shows normal female external genitalia and no lesions. The urethra is normal in appearance and not tender. There is no urethral mass. Vaginal exam demonstrates no abnormalities. No cystocele is identified. The adnexa are palpably normal. The bladder is non tender and not distended. The anus is normal on inspection. The perineum is normal on inspection.  Lymphatics: The femoral and inguinal nodes are not enlarged or tender.  Skin: Normal skin turgor, no visible rash and no visible skin lesions.  Neuro/Psych:. Mood and affect are appropriate.    Results/Data Urine [Data Includes: Last 1 Day]  15May2012  COLOR: YELLOW  Reference Range YELLOW APPEARANCE: CLEAR  Reference Range CLEAR SPECIFIC GRAVITY: 1.025  Reference Range 1.005-1.030 pH: 6.0  Reference Range 5.0-8.0 GLUCOSE: NEG mg/dL Reference Range NEG BILIRUBIN: NEG  Reference Range NEG KETONE: NEG mg/dL Reference Range NEG BLOOD: TRACE  Abnormal Reference Range NEG PROTEIN: NEG mg/dL Reference Range NEG UROBILINOGEN: 0.2 mg/dL Reference Range 1.6-1.0 NITRITE: NEG  Reference Range NEG LEUKOCYTE ESTERASE: NEG  Reference Range NEG SQUAMOUS  EPITHELIAL/HPF: RARE  Reference Range RARE WBC: 0-3 WBC/hpf Reference Range <4 RBC: NONE SEEN RBC/hpf Reference Range  <4 BACTERIA: NONE SEEN  Reference Range RARE CRYSTALS: NONE SEEN  Reference Range NEG CASTS: NONE SEEN  Reference Range NEG  Assessment Assessed  1. Female Stress Incontinence 625.6      She has stress urinary incontinence by her history. I do not believe there is any indication that she has detrusor instability based on her history however obtaining the results of her urodynamic study would be helpful in confirming that suspicion. We discussed surgical correction of her condition with a mid urethral sling procedure. I went over the procedure as well as the incisions used in detail. We've discussed the risks and complications associated with this type of surgery. I also have given her a pamphlet describing the procedure as well as its potential risks. She would like to proceed with surgical correction.   Plan Female Stress Incontinence (625.6)  1. Pelvic Exam  Requested for: 15May2012 Health Maintenance (V70.0)  2. UA With REFLEX  Done: 15May2012 08:39AM      1. She is going to obtain the urodynamics results and get those to me. 2. I have tentatively scheduled her for a suprapubic sling under general anesthesia as an outpatient.

## 2011-01-05 ENCOUNTER — Ambulatory Visit (HOSPITAL_BASED_OUTPATIENT_CLINIC_OR_DEPARTMENT_OTHER): Payer: PRIVATE HEALTH INSURANCE | Admitting: Anesthesiology

## 2011-01-05 ENCOUNTER — Encounter (HOSPITAL_BASED_OUTPATIENT_CLINIC_OR_DEPARTMENT_OTHER)
Admission: RE | Disposition: A | Discharge status: Home / Self Care | Payer: Self-pay | Source: Ambulatory Visit | Attending: Urology

## 2011-01-05 ENCOUNTER — Ambulatory Visit (HOSPITAL_BASED_OUTPATIENT_CLINIC_OR_DEPARTMENT_OTHER)
Admission: RE | Admit: 2011-01-05 | Discharge: 2011-01-05 | Disposition: A | Discharge status: Home / Self Care | Payer: PRIVATE HEALTH INSURANCE | Source: Ambulatory Visit | Attending: Urology | Admitting: Urology

## 2011-01-05 ENCOUNTER — Encounter (HOSPITAL_BASED_OUTPATIENT_CLINIC_OR_DEPARTMENT_OTHER): Payer: Self-pay | Admitting: Anesthesiology

## 2011-01-05 DIAGNOSIS — I10 Essential (primary) hypertension: Secondary | ICD-10-CM | POA: Insufficient documentation

## 2011-01-05 DIAGNOSIS — Z79899 Other long term (current) drug therapy: Secondary | ICD-10-CM | POA: Insufficient documentation

## 2011-01-05 DIAGNOSIS — J45909 Unspecified asthma, uncomplicated: Secondary | ICD-10-CM | POA: Insufficient documentation

## 2011-01-05 DIAGNOSIS — K219 Gastro-esophageal reflux disease without esophagitis: Secondary | ICD-10-CM | POA: Insufficient documentation

## 2011-01-05 DIAGNOSIS — N393 Stress incontinence (female) (male): Secondary | ICD-10-CM | POA: Diagnosis present

## 2011-01-05 HISTORY — DX: Gastro-esophageal reflux disease without esophagitis: K21.9

## 2011-01-05 HISTORY — DX: Essential (primary) hypertension: I10

## 2011-01-05 HISTORY — PX: PUBOVAGINAL SLING: SHX1035

## 2011-01-05 HISTORY — DX: Stress incontinence (female) (male): N39.3

## 2011-01-05 LAB — POCT PREGNANCY, URINE: Preg Test, Ur: NEGATIVE

## 2011-01-05 LAB — POCT I-STAT 4, (NA,K, GLUC, HGB,HCT)
Glucose, Bld: 101 mg/dL — ABNORMAL HIGH (ref 70–99)
Glucose, Bld: 106 mg/dL — ABNORMAL HIGH (ref 70–99)
HCT: 38 % (ref 36.0–46.0)
HCT: 19 % — ABNORMAL LOW (ref 36.0–46.0)
Hemoglobin: 12.9 g/dL (ref 12.0–15.0)
Hemoglobin: 6.5 g/dL — CL (ref 12.0–15.0)
Potassium: 3.1 mEq/L — ABNORMAL LOW (ref 3.5–5.1)
Potassium: 3.5 mEq/L (ref 3.5–5.1)
Sodium: 142 mEq/L (ref 135–145)
Sodium: 142 mEq/L (ref 135–145)

## 2011-01-05 SURGERY — CREATION, PUBOVAGINAL SLING
Anesthesia: General | Site: Bladder | Wound class: Clean

## 2011-01-05 MED ORDER — FENTANYL CITRATE 0.05 MG/ML IJ SOLN
INTRAMUSCULAR | Status: DC | PRN
  Administered 2011-01-05 (×2): 25 ug via INTRAVENOUS
  Administered 2011-01-05 (×3): 50 ug via INTRAVENOUS

## 2011-01-05 MED ORDER — ACETAMINOPHEN 10 MG/ML IV SOLN
INTRAVENOUS | Status: DC | PRN
  Administered 2011-01-05: 1000 mg via INTRAVENOUS

## 2011-01-05 MED ORDER — LIDOCAINE HCL (CARDIAC) 20 MG/ML IV SOLN
INTRAVENOUS | Status: DC | PRN
  Administered 2011-01-05: 100 mg via INTRAVENOUS

## 2011-01-05 MED ORDER — HYDROCODONE-ACETAMINOPHEN 10-300 MG PO TABS: 1.0000 | ORAL_TABLET | Freq: Four times a day (QID) | ORAL | Status: DC | PRN

## 2011-01-05 MED ORDER — PROMETHAZINE HCL 25 MG/ML IJ SOLN: 6.2500 mg | INTRAMUSCULAR | Status: DC | PRN

## 2011-01-05 MED ORDER — LACTATED RINGERS IV SOLN
INTRAVENOUS | Status: DC
  Administered 2011-01-05 (×2): via INTRAVENOUS

## 2011-01-05 MED ORDER — PROPOFOL 10 MG/ML IV EMUL
INTRAVENOUS | Status: DC | PRN
  Administered 2011-01-05: 200 mg via INTRAVENOUS

## 2011-01-05 MED ORDER — HYDROCODONE-ACETAMINOPHEN 5-325 MG PO TABS: 1.0000 | ORAL_TABLET | Freq: Four times a day (QID) | ORAL | Status: DC | PRN

## 2011-01-05 MED ORDER — MIDAZOLAM HCL 5 MG/5ML IJ SOLN
INTRAMUSCULAR | Status: DC | PRN
  Administered 2011-01-05: 2 mg via INTRAVENOUS

## 2011-01-05 MED ORDER — BUPIVACAINE-EPINEPHRINE 0.5% -1:200000 IJ SOLN
INTRAMUSCULAR | Status: DC | PRN
  Administered 2011-01-05: 20 mL

## 2011-01-05 MED ORDER — STERILE WATER FOR IRRIGATION IR SOLN
Status: DC | PRN
  Administered 2011-01-05: 3000 mL

## 2011-01-05 MED ORDER — ACETAMINOPHEN 10 MG/ML IV SOLN: 1000.0000 mg | Freq: Once | INTRAVENOUS | Status: DC

## 2011-01-05 MED ORDER — SODIUM CHLORIDE 0.9 % IR SOLN
Status: DC | PRN
  Administered 2011-01-05: 07:00:00

## 2011-01-05 MED ORDER — FENTANYL CITRATE 0.05 MG/ML IJ SOLN
25.0000 ug | INTRAMUSCULAR | Status: DC | PRN
  Administered 2011-01-05 (×2): 25 ug via INTRAVENOUS

## 2011-01-05 MED ORDER — CEFAZOLIN SODIUM-DEXTROSE 2-3 GM-% IV SOLR
2.0000 g | Freq: Once | INTRAVENOUS | Status: AC
Start: 2011-01-05 — End: 2011-01-05
  Administered 2011-01-05: 2 g via INTRAVENOUS

## 2011-01-05 MED ORDER — LACTATED RINGERS IV SOLN: INTRAVENOUS | Status: DC

## 2011-01-05 MED ORDER — ONDANSETRON HCL 4 MG/2ML IJ SOLN
INTRAMUSCULAR | Status: DC | PRN
  Administered 2011-01-05: 4 mg via INTRAVENOUS

## 2011-01-05 MED ORDER — DEXAMETHASONE SODIUM PHOSPHATE 4 MG/ML IJ SOLN
INTRAMUSCULAR | Status: DC | PRN
  Administered 2011-01-05: 8 mg via INTRAVENOUS

## 2011-01-05 MED ORDER — CIPROFLOXACIN HCL 500 MG PO TABS: 500.0000 mg | ORAL_TABLET | Freq: Two times a day (BID) | ORAL | Status: AC

## 2011-01-05 SURGICAL SUPPLY — 42 items
ADH SKN CLS APL DERMABOND .7 (GAUZE/BANDAGES/DRESSINGS) ×1
BAG URINE DRAINAGE (UROLOGICAL SUPPLIES) IMPLANT
BAG URINE LEG 500ML (DRAIN) ×1 IMPLANT
BLADE SURG 10 STRL SS (BLADE) IMPLANT
BLADE SURG 15 STRL LF DISP TIS (BLADE) ×1 IMPLANT
BLADE SURG 15 STRL SS (BLADE) ×2
BLADE SURG ROTATE 9660 (MISCELLANEOUS) ×2 IMPLANT
CATH FOLEY 2WAY SLVR  5CC 16FR (CATHETERS) ×1
CATH FOLEY 2WAY SLVR 5CC 16FR (CATHETERS) ×1 IMPLANT
CLOTH BEACON ORANGE TIMEOUT ST (SAFETY) ×2 IMPLANT
COVER MAYO STAND STRL (DRAPES) ×2 IMPLANT
DERMABOND ADVANCED (GAUZE/BANDAGES/DRESSINGS) ×1
DERMABOND ADVANCED .7 DNX12 (GAUZE/BANDAGES/DRESSINGS) ×1 IMPLANT
DRAPE LG THREE QUARTER DISP (DRAPES) ×2 IMPLANT
DRAPE UNDERBUTTOCKS STRL (DRAPE) ×2 IMPLANT
ELECT REM PT RETURN 9FT ADLT (ELECTROSURGICAL) ×2
ELECTRODE REM PT RTRN 9FT ADLT (ELECTROSURGICAL) ×1 IMPLANT
GAUZE PACKING IODOFORM 2 (PACKING) IMPLANT
GLOVE BIO SURGEON STRL SZ7 (GLOVE) ×2 IMPLANT
GLOVE BIO SURGEON STRL SZ8 (GLOVE) ×3 IMPLANT
GLOVE INDICATOR 6.5 STRL GRN (GLOVE) ×2 IMPLANT
GOWN STRL REIN XL XLG (GOWN DISPOSABLE) ×2 IMPLANT
GOWN SURGICAL LARGE (GOWNS) ×1 IMPLANT
GOWN XL W/COTTON TOWEL STD (GOWNS) ×2 IMPLANT
IV NS IRRIG 3000ML ARTHROMATIC (IV SOLUTION) IMPLANT
NEEDLE HYPO 22GX1.5 SAFETY (NEEDLE) ×2 IMPLANT
NS IRRIG 500ML POUR BTL (IV SOLUTION) IMPLANT
PACK BASIN DAY SURGERY FS (CUSTOM PROCEDURE TRAY) ×2 IMPLANT
PACK CYSTOSCOPY (CUSTOM PROCEDURE TRAY) ×2 IMPLANT
PENCIL BUTTON HOLSTER BLD 10FT (ELECTRODE) ×2 IMPLANT
PLUG CATH AND CAP STER (CATHETERS) ×2 IMPLANT
SLING SYSTEM SPARC (Sling) ×1 IMPLANT
SUT ETHIBOND 0 (SUTURE) IMPLANT
SUT SILK 2 0 30  PSL (SUTURE)
SUT SILK 2 0 30 PSL (SUTURE) IMPLANT
SUT VIC AB 2-0 UR6 27 (SUTURE) ×4 IMPLANT
SYR BULB IRRIGATION 50ML (SYRINGE) ×2 IMPLANT
SYRINGE 10CC LL (SYRINGE) ×2 IMPLANT
TUBE CONNECTING 12X1/4 (SUCTIONS) ×2 IMPLANT
WATER STERILE IRR 3000ML UROMA (IV SOLUTION) IMPLANT
WATER STERILE IRR 500ML POUR (IV SOLUTION) ×2 IMPLANT
YANKAUER SUCT BULB TIP NO VENT (SUCTIONS) ×2 IMPLANT

## 2011-01-05 NOTE — Anesthesia Preprocedure Evaluation (Addendum)
Anesthesia Evaluation  Patient identified by MRN, date of birth, ID band Patient awake    Reviewed: Allergy & Precautions, H&P , NPO status , Patient's Chart, lab work & pertinent test results  Airway Mallampati: II TM Distance: >3 FB Neck ROM: full    Dental No notable dental hx. (+) Teeth Intact   Pulmonary asthma ,  clear to auscultation  Pulmonary exam normal       Cardiovascular Exercise Tolerance: Good hypertension, On Medications neg cardio ROS regular Normal    Neuro/Psych Negative Neurological ROS  Negative Psych ROS   GI/Hepatic Neg liver ROS, GERD-  Controlled and Medicated,  Endo/Other  Negative Endocrine ROS  Renal/GU negative Renal ROS  Genitourinary negative   Musculoskeletal   Abdominal   Peds  Hematology negative hematology ROS (+)   Anesthesia Other Findings   Reproductive/Obstetrics negative OB ROS                          Anesthesia Physical Anesthesia Plan  ASA: II  Anesthesia Plan: General   Post-op Pain Management:    Induction: Intravenous  Airway Management Planned: LMA  Additional Equipment:   Intra-op Plan:   Post-operative Plan:   Informed Consent: I have reviewed the patients History and Physical, chart, labs and discussed the procedure including the risks, benefits and alternatives for the proposed anesthesia with the patient or authorized representative who has indicated his/her understanding and acceptance.   Dental Advisory Given  Plan Discussed with: CRNA and Surgeon  Anesthesia Plan Comments:        Anesthesia Quick Evaluation

## 2011-01-05 NOTE — Anesthesia Postprocedure Evaluation (Signed)
  Anesthesia Post-op Note  Patient: Rachael Calderon  Procedure(s) Performed:  PUBO-VAGINAL SLING - pubo- vaginal sling  Patient Location: PACU  Anesthesia Type: General  Level of Consciousness: awake and alert   Airway and Oxygen Therapy: Patient Spontanous Breathing  Post-op Pain: mild  Post-op Assessment: Post-op Vital signs reviewed, Patient's Cardiovascular Status Stable, Respiratory Function Stable, Patent Airway and No signs of Nausea or vomiting  Post-op Vital Signs: stable  Complications: No apparent anesthesia complications

## 2011-01-05 NOTE — Anesthesia Procedure Notes (Addendum)
Procedure Name: LMA Insertion Date/Time: 01/05/2011 7:41 AM Performed by: Huel Coventry Pre-anesthesia Checklist: Patient identified, Emergency Drugs available, Suction available and Patient being monitored Patient Re-evaluated:Patient Re-evaluated prior to inductionOxygen Delivery Method: Circle System Utilized Preoxygenation: Pre-oxygenation with 100% oxygen Intubation Type: IV induction Ventilation: Mask ventilation without difficulty LMA: LMA with gastric port inserted LMA Size: 4.0 Number of attempts: 1 Placement Confirmation: positive ETCO2 Tube secured with: Tape Dental Injury: Teeth and Oropharynx as per pre-operative assessment

## 2011-01-05 NOTE — Op Note (Signed)
PATIENT:  Rachael Calderon  PRE-OPERATIVE DIAGNOSIS:  Stress urinary incontinence  POST-OPERATIVE DIAGNOSIS:  Same  PROCEDURE:  Procedure(s): 1. Suprapubic sling ([facility]) 2. Cystoscopy  SURGEON:  Surgeon(s): Garnett Farm  ANESTHESIA:  General  EBL:  less than 50 mL  DRAINS: 16 French Foley catheter  LOCAL MEDICATIONS USED:  Half percent Marcaine with epinephrine  SPECIMEN:  None  DISPOSITION OF SPECIMEN:  N/A  Description of procedure: After informed consent the patient was brought to the major OR and placed on the table. She was administered general anesthesia and then moved to the dorsal lithotomy position. Her lower abdomen and genitalia including vagina was sterilely prepped and draped. An official timeout was then performed.  A 16 French Foley catheter was placed in the bladder and the bladder was drained. A weighted speculum was then placed in the vagina and I injected 14 cc of Marcaine with epinephrine in the suburethral region. After allowing adequate time for epinephrine effect a midline incision was made over the urethra which was noted by palpation of the Foley catheter tubing and balloon. I then dissected beneath the vaginal mucosa laterally around the urethra and was then able to insert my index finger and palpate the undersurface of the symphysis pubis. The wound was irrigated with antibiotic solution.  Attention was turned to the suprapubic region and I injected Marcaine with epinephrine at 2 locations approximately 3 finger widths apart just above the palpated symphysis pubis. A stab incision was then made in these locations. I then made sure the bladder was completely empty by draining through the Foley catheter. The suprapubic trocar was then passed through the skin incision, down behind the symphysis pubis and directed slightly laterally until it was in the area of the vagina what was redirected medially and was palpated and directed out through the vaginal incision  at the mid urethral level. This was performed first on the left and then the right side.  The Foley catheter was removed and cystoscopy was performed. The 22 French cystoscope with 70 lens was inserted in the bladder and the bladder was fully and systematically inspected. No tumor stones or inflammatory lesions were seen. Ureteral orifices were of normal configuration and position. There was no evidence of foreign body or perforation of the bladder noted. I therefore drained the bladder, removed the cystoscope and reinserted the Foley catheter.  The sling material was then affixed to the distal aspect of each of the trochars and withdrawn back through the suprapubic incisions. I then repeated the cystoscopy again noting no evidence of sling material or foreign body within the bladder. I therefore divided the sling sheath and passed antibiotic solution through the sheath on each side. The sheath was grasped with a hemostat and the sling was then withdrawn into a position at the mid urethra. I placed forceps beneath the urethra and then removed the sheathing first on the left side and then on the right-hand side. This was performed in such a manner that there was no tension noted on the sling material. The excess sling material was then excised at the skin level and the skin incisions were closed with Dermabond after irrigation with antibiotic solution.  Attention was then redirected to the vagina and the incision was irrigated copiously with antibiotic solution. I then closed the incision with running 2-0 Vicryl suture. The Foley catheter was left indwelling. The patient tolerated the procedure well and there were no intraoperative complications.  She will be discharged with a Foley catheter  indwelling which will remain for 24 hours and she will be instructed to remove the catheter at home.  PLAN OF CARE: Discharge to home after PACU  PATIENT DISPOSITION:  PACU - hemodynamically stable.

## 2011-01-05 NOTE — Interval H&P Note (Signed)
History and Physical Interval Note:   01/05/2011   7:28 AM   Rachael Calderon  has presented today for surgery, with the diagnosis of STRESS INCONTINENCE  The various methods of treatment have been discussed with the patient and family. After consideration of risks, benefits and other options for treatment, the patient has consented to  Procedure(s): PUBO-VAGINAL SLING as a surgical intervention .  The patients' history has been reviewed, patient examined, no change in status, stable for surgery.  I have reviewed the patients' chart and labs.  Questions were answered to the patient's satisfaction.     Garnett Farm  MD

## 2011-01-05 NOTE — Transfer of Care (Signed)
Immediate Anesthesia Transfer of Care Note  Patient: Rachael Calderon  Procedure(s) Performed:  PUBO-VAGINAL SLING - pubo- vaginal sling  Patient Location: PACU  Anesthesia Type: General  Level of Consciousness: awake, alert  and oriented  Airway & Oxygen Therapy: Patient Spontanous Breathing and Patient connected to face mask oxygen  Post-op Assessment: Report given to PACU RN and Post -op Vital signs reviewed and stable  Post vital signs: Reviewed and stable  Complications: No apparent anesthesia complications

## 2011-01-13 NOTE — Addendum Note (Signed)
Addendum  created 01/13/11 1252 by Yang Rack   Modules edited:Anesthesia Responsible Staff    

## 2011-01-13 NOTE — Addendum Note (Signed)
Addendum  created 01/13/11 1252 by Lorrin Jackson   Modules edited:Anesthesia Responsible Staff

## 2011-01-15 ENCOUNTER — Encounter (HOSPITAL_BASED_OUTPATIENT_CLINIC_OR_DEPARTMENT_OTHER): Payer: Self-pay | Admitting: Urology

## 2011-01-15 NOTE — Addendum Note (Signed)
Addendum  created 01/15/11 1040 by Huel Coventry   Modules edited:Anesthesia Responsible Staff

## 2011-01-15 NOTE — Addendum Note (Signed)
Addendum  created 01/15/11 1040 by Drayton Tieu   Modules edited:Anesthesia Responsible Staff    

## 2011-03-03 ENCOUNTER — Encounter: Payer: Self-pay | Admitting: Internal Medicine

## 2011-03-03 ENCOUNTER — Ambulatory Visit (INDEPENDENT_AMBULATORY_CARE_PROVIDER_SITE_OTHER): Payer: PRIVATE HEALTH INSURANCE | Admitting: Internal Medicine

## 2011-03-03 VITALS — BP 132/88 | HR 70 | Temp 98.2°F | Resp 16 | Wt 236.0 lb

## 2011-03-03 DIAGNOSIS — J45909 Unspecified asthma, uncomplicated: Secondary | ICD-10-CM

## 2011-03-03 DIAGNOSIS — J309 Allergic rhinitis, unspecified: Secondary | ICD-10-CM

## 2011-03-03 MED ORDER — FLUTICASONE PROPIONATE 50 MCG/ACT NA SUSP: 2.0000 | Freq: Every day | NASAL | Status: DC

## 2011-03-03 MED ORDER — AMOXICILLIN 500 MG PO CAPS: 1000.0000 mg | ORAL_CAPSULE | Freq: Two times a day (BID) | ORAL | Status: AC

## 2011-03-03 MED ORDER — PREDNISONE 10 MG PO TABS: ORAL_TABLET | ORAL | Status: DC

## 2011-03-03 NOTE — Patient Instructions (Signed)
Rest, fluids , tylenol For cough, take Mucinex   twice a day as needed  flonase as prescribed  If not better in few days , start the antibiotic Call if no better in few days Call anytime if the symptoms are severe

## 2011-03-03 NOTE — Progress Notes (Signed)
  Subjective:    Patient ID: Rachael Calderon, female    DOB: [birth year], 41 y.o.   MRN: [id number]  HPI Acute visit Approximately 5 days ago she developed sinus pain, postnasal dripping. This usually happens every year around this time, she believes is allergies. Historically   prednisone helps.  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 As far as her asthma, symptoms are well controlled with symbicort , hardly ever uses albuterol even in the last few days    Objective:   Physical Exam  Constitutional: She appears well-developed. No distress.  HENT:  Head: Normocephalic and atraumatic.  Right Ear: External ear normal.  Left Ear: External ear normal.       Nose quite congested, face is symmetric and not tender to palpation  Cardiovascular: Normal rate, regular rhythm, normal heart sounds and intact distal pulses.   No murmur heard. Pulmonary/Chest: Effort normal and breath sounds normal. No respiratory distress. She has no wheezes. She has no rales.  Skin: She is not diaphoretic.       Assessment & Plan:

## 2011-03-03 NOTE — Assessment & Plan Note (Addendum)
Sx exacerbated x few days, likely sinusitis is allergic;see instructions  If not improved will rec abx in case she has also a infection

## 2011-03-03 NOTE — Assessment & Plan Note (Addendum)
Currently well controlled with Symbicort, rarely uses albuterol. She already had a flu shot

## 2011-08-12 ENCOUNTER — Other Ambulatory Visit: Payer: Self-pay | Admitting: Family Medicine

## 2011-08-12 DIAGNOSIS — Z1231 Encounter for screening mammogram for malignant neoplasm of breast: Secondary | ICD-10-CM

## 2011-09-08 ENCOUNTER — Ambulatory Visit: Payer: PRIVATE HEALTH INSURANCE

## 2011-09-10 ENCOUNTER — Ambulatory Visit
Admission: RE | Admit: 2011-09-10 | Discharge: 2011-09-10 | Disposition: A | Discharge status: Home / Self Care | Payer: Managed Care, Other (non HMO) | Source: Ambulatory Visit | Attending: Family Medicine | Admitting: Family Medicine

## 2011-09-10 DIAGNOSIS — Z1231 Encounter for screening mammogram for malignant neoplasm of breast: Secondary | ICD-10-CM

## 2012-02-24 ENCOUNTER — Encounter: Payer: Self-pay | Admitting: Pulmonary Disease

## 2012-02-24 ENCOUNTER — Ambulatory Visit (INDEPENDENT_AMBULATORY_CARE_PROVIDER_SITE_OTHER): Payer: Managed Care, Other (non HMO) | Admitting: Pulmonary Disease

## 2012-02-24 ENCOUNTER — Other Ambulatory Visit: Payer: Managed Care, Other (non HMO)

## 2012-02-24 VITALS — BP 122/84 | HR 76 | Temp 98.8°F | Ht 67.0 in | Wt 242.4 lb

## 2012-02-24 DIAGNOSIS — J45909 Unspecified asthma, uncomplicated: Secondary | ICD-10-CM

## 2012-02-24 DIAGNOSIS — J329 Chronic sinusitis, unspecified: Secondary | ICD-10-CM | POA: Insufficient documentation

## 2012-02-24 MED ORDER — PREDNISONE 10 MG PO TABS: ORAL_TABLET | ORAL | Status: DC

## 2012-02-24 NOTE — Progress Notes (Signed)
  Subjective:    Patient ID: Rachael Calderon, female    DOB: 02/06/1971, 42 y.o.   MRN: [id number]  HPI The patient is a 42 year old female who I've been asked to see for significant asthma.  The patient states that she was diagnosed with asthma as a teenager, and overall she has had poor control over the years.  She has been treated with an excellent bronchodilator regimen, but has had issues with chronic sinusitis that has required 2 surgeries.  She has not followed up with otolaryngology since 2005, and her last sinus infection was approximately 6 months ago.  She has significant postnasal drip, but is not willing to take topical nasal steroids or sinus rinses.  She has had allergy testing on numerous occasions, and has had allergy vaccine when younger.  She was even treated with Xolair in 2007, but is unsure if this helped her.  She also has a history of reflux disease, and currently is on a proton pump inhibitor without significant breakthrough.  She denies any seasonality to her asthma, but does state that whether changes are the primary exacerbating factor for her other than infection.  The patient was last on prednisone in November of last year, and has stopped taking Singulair on her own.  She admits that she does not take symbicort religiously on a b.i.d basis.  Her last CT of her sinuses was in 2011, and showed extensive chronic sinusitis.  She has not had a recent chest x-ray or spirometry.   Review of Systems  Constitutional: Positive for unexpected weight change ( increase). Negative for fever.  HENT: Positive for ear pain, congestion, rhinorrhea, sneezing and postnasal drip. Negative for nosebleeds, sore throat, trouble swallowing, dental problem and sinus pressure.   Eyes: Negative for redness and itching.  Respiratory: Positive for cough, shortness of breath ( with exertion ) and wheezing. Negative for chest tightness.   Cardiovascular: Negative for palpitations and leg swelling.    Gastrointestinal: Negative for nausea and vomiting.  Genitourinary: Negative for dysuria.  Musculoskeletal: Negative for joint swelling.  Skin: Negative for rash.  Neurological: Negative for headaches.  Hematological: Does not bruise/bleed easily.  Psychiatric/Behavioral: Negative for dysphoric mood. The patient is not nervous/anxious.        Objective:   Physical Exam Constitutional:  Well developed, no acute distress  HENT:  Nares patent without discharge  Oropharynx without exudate, palate and uvula are elongated.   Eyes:  Perrla, eomi, no scleral icterus  Neck:  No JVD, no TMG  Cardiovascular:  Normal rate, regular rhythm, no rubs or gallops.  No murmurs        Intact distal pulses  Pulmonary :  Mildly decreased breath sounds, no stridor or respiratory distress   No rales, rhonchi, or wheezing  Abdominal:  Soft, nondistended, bowel sounds present.  No tenderness noted.   Musculoskeletal:  No lower extremity edema noted.  Lymph Nodes:  No cervical lymphadenopathy noted  Skin:  No cyanosis noted  Neurologic:  Alert, appropriate, moves all 4 extremities without obvious deficit.         Assessment & Plan:

## 2012-02-24 NOTE — Assessment & Plan Note (Addendum)
Patient has a history of very significant asthma that has been difficult to control for many years.  She currently is not taking symbicort on a religious basis, and I have discussed this with her.  She also has a history of underlying chronic sinus disease, and has had 2 surgeries in the past.  She sounds very congested at this time, and I have highly recommended reimaging of her sinuses since this has not been done in many many years.  The patient does not want to do this at this time, and I stressed to her the importance of the workup.  She also is unwilling to do nasal sprays or sinus rinses.  She also has a long history of allergies, but did not see improvement with allergy vaccine or even Xolair in 2007.  It is unclear under what circumstances this was done.  I will try and get the records from her local allergist.  In the meantime, I would like to do rast testing for evaluation.  Finally, I always worry about the role of reflux disease and refractory asthma, but the patient has been taking her proton pump inhibitor regularly and denies any breakthrough symptoms.  I have had a long discussion with the patient today, and she is clearly frustrated by all of the evaluations, workups, and treatments in the past that did not result in improvement of her breathing.  However, I also question whether she has ever totally committed to a comprehensive treatment plan that involves all of the different factors that can exacerbate her asthma.  Her asthma will not improve until her chronic sinus disease has been adequately treated, her allergies have been treated, and she is totally compliant with a comprehensive treatment plan.  I am not sure she is willing to do this currently, and I have tried to warn her about the ravages of chronic prednisone use over time.  Given the severity of her airflow obstruction, and the fact this is mostly FIXED disease, I think it would be worthwhile adding spiriva to her her BD regimen.

## 2012-02-24 NOTE — Assessment & Plan Note (Signed)
The patient currently has significant sinus congestion, and I have explained to her there is no way that we can keep her asthma controlled unless her sinuses are properly treated.  I have recommended a scan of her sinuses, but she feels is not necessary.  I have asked her to reconsider.

## 2012-02-24 NOTE — Patient Instructions (Addendum)
Stay on your reflux medication, as well as symbicort. Will check RAST testing today to evaluate for allergies. Would strongly consider imaging of your sinuses.  Let us know if you are agreeable.  Will call you with results of your bloodwork. Will try spiriva one inhalation each am along with your symbicort. Will treat with a short course of prednisone, but realize this is just treating a symptom and NOT treating your problem.

## 2012-02-25 LAB — ALLERGY FULL PROFILE
Allergen, D pternoyssinus,d7: 0.21 kU/L — ABNORMAL HIGH
Allergen,Goose feathers, e70: <0.1 kU/L
Box Elder IgE: <0.1 kU/L
Common Ragweed: <0.1 kU/L
Elm IgE: <0.1 kU/L
G005 Rye, Perennial: <0.1 kU/L
G009 Red Top: <0.1 kU/L
House Dust Hollister: 0.15 kU/L — ABNORMAL HIGH
IgE (Immunoglobulin E), Serum: 401.7 IU/mL — ABNORMAL HIGH (ref 0.0–180.0)
Oak: <0.1 kU/L
Stemphylium Botryosum: <0.1 kU/L
Timothy Grass: <0.1 kU/L

## 2012-02-29 ENCOUNTER — Encounter: Payer: Self-pay | Admitting: Pulmonary Disease

## 2012-08-01 ENCOUNTER — Other Ambulatory Visit: Payer: Self-pay

## 2012-08-01 DIAGNOSIS — Z1231 Encounter for screening mammogram for malignant neoplasm of breast: Secondary | ICD-10-CM

## 2012-09-12 ENCOUNTER — Ambulatory Visit: Payer: Managed Care, Other (non HMO)

## 2012-09-12 ENCOUNTER — Ambulatory Visit
Admission: RE | Admit: 2012-09-12 | Discharge: 2012-09-12 | Disposition: A | Discharge status: Home / Self Care | Payer: Managed Care, Other (non HMO) | Source: Ambulatory Visit

## 2012-09-12 DIAGNOSIS — Z1231 Encounter for screening mammogram for malignant neoplasm of breast: Secondary | ICD-10-CM

## 2013-01-06 ENCOUNTER — Other Ambulatory Visit (HOSPITAL_COMMUNITY)
Admission: RE | Admit: 2013-01-06 | Discharge: 2013-01-06 | Disposition: A | Discharge status: Home / Self Care | Payer: Managed Care, Other (non HMO) | Source: Ambulatory Visit | Attending: Family Medicine | Admitting: Family Medicine

## 2013-01-06 ENCOUNTER — Other Ambulatory Visit: Payer: Self-pay | Admitting: Family Medicine

## 2013-01-06 DIAGNOSIS — Z1151 Encounter for screening for human papillomavirus (HPV): Secondary | ICD-10-CM | POA: Insufficient documentation

## 2013-01-06 DIAGNOSIS — Z124 Encounter for screening for malignant neoplasm of cervix: Secondary | ICD-10-CM | POA: Insufficient documentation

## 2013-01-13 ENCOUNTER — Encounter (HOSPITAL_COMMUNITY): Payer: Self-pay | Admitting: Emergency Medicine

## 2013-01-13 ENCOUNTER — Emergency Department (INDEPENDENT_AMBULATORY_CARE_PROVIDER_SITE_OTHER)
Admission: EM | Admit: 2013-01-13 | Discharge: 2013-01-13 | Disposition: A | Discharge status: Home / Self Care | Payer: Managed Care, Other (non HMO) | Source: Home / Self Care | Attending: Emergency Medicine | Admitting: Emergency Medicine

## 2013-01-13 DIAGNOSIS — J019 Acute sinusitis, unspecified: Secondary | ICD-10-CM

## 2013-01-13 DIAGNOSIS — J45909 Unspecified asthma, uncomplicated: Secondary | ICD-10-CM

## 2013-01-13 DIAGNOSIS — J069 Acute upper respiratory infection, unspecified: Secondary | ICD-10-CM

## 2013-01-13 MED ORDER — AMOXICILLIN-POT CLAVULANATE 875-125 MG PO TABS: 1.0000 | ORAL_TABLET | Freq: Two times a day (BID) | ORAL | Status: DC

## 2013-01-13 MED ORDER — DEXAMETHASONE SODIUM PHOSPHATE 10 MG/ML IJ SOLN
10.0000 mg | Freq: Once | INTRAMUSCULAR | Status: AC
  Administered 2013-01-13: 10 mg via INTRAMUSCULAR

## 2013-01-13 MED ORDER — DEXAMETHASONE SODIUM PHOSPHATE 10 MG/ML IJ SOLN
INTRAMUSCULAR | Status: AC
  Filled 2013-01-13: qty 1

## 2013-01-13 MED ORDER — PREDNISONE 20 MG PO TABS: ORAL_TABLET | ORAL | Status: DC

## 2013-01-13 MED ORDER — HYDROCOD POLST-CHLORPHEN POLST 10-8 MG/5ML PO LQCR: 5.0000 mL | Freq: Two times a day (BID) | ORAL | Status: DC | PRN

## 2013-01-13 NOTE — ED Notes (Signed)
My sinuses are acting up, and this makes my asthma act up ; NAD

## 2013-01-13 NOTE — ED Provider Notes (Signed)
Chief Complaint:   Chief Complaint  Patient presents with  . Cough    History of Present Illness:   Kissy Cielo is a 42 year old female who's had a one-week history of cough productive yellow sputum, aching in the chest, nasal congestion with yellow, bloody drainage, sinus pressure, headache, ear congestion, watery eyes, postnasal drip, sore throat, and hoarseness. She has a history of asthma since she was 42 years old. She normally controls this with albuterol and Symbicort which she has plenty of and her wheezing is been a little bit worse.  Review of Systems:  Other than noted above, the patient denies any of the following symptoms: Systemic:  No fevers, chills, sweats, weight loss or gain, fatigue, or tiredness. Eye:  No redness or discharge. ENT:  No ear pain, drainage, headache, nasal congestion, drainage, sinus pressure, difficulty swallowing, or sore throat. Neck:  No neck pain or swollen glands. Lungs:  No cough, sputum production, hemoptysis, wheezing, chest tightness, shortness of breath or chest pain. GI:  No abdominal pain, nausea, vomiting or diarrhea.  PMFSH:  Past medical history, family history, social history, meds, and allergies were reviewed. She's allergic to sulfa. She has high blood pressure and GERD. She takes HCTZ and Protonix.  Physical Exam:   Vital signs:  BP 162/97  Pulse 76  Temp(Src) 98.3 F (36.8 C) (Oral)  Resp 16  SpO2 97% General:  Alert and oriented.  In no distress.  Skin warm and dry. Eye:  No conjunctival injection or drainage. Lids were normal. ENT:  TMs and canals were normal, without erythema or inflammation.  Nasal mucosa was clear and uncongested, without drainage.  Mucous membranes were moist.  Pharynx was clear with no exudate or drainage.  There were no oral ulcerations or lesions. Neck:  Supple, no adenopathy, tenderness or mass. Lungs:  No respiratory distress.  Lungs were clear to auscultation, without wheezes, rales or rhonchi.   Breath sounds were clear and equal bilaterally.  Heart:  Regular rhythm, without gallops, murmers or rubs. Skin:  Clear, warm, and dry, without rash or lesions.  Assessment:  The primary encounter diagnosis was Viral upper respiratory infection. Diagnoses of Acute sinusitis and Asthma were also pertinent to this visit.  Plan:   1.  Meds:  The following meds were prescribed:   Discharge Medication List as of 01/13/2013  6:14 PM    START taking these medications   Details  amoxicillin-clavulanate (AUGMENTIN) 875-125 MG per tablet Take 1 tablet by mouth 2 (two) times daily., Starting 01/13/2013, Until Discontinued, Normal    chlorpheniramine-HYDROcodone (TUSSIONEX) 10-8 MG/5ML LQCR Take 5 mLs by mouth every 12 (twelve) hours as needed for cough., Starting 01/13/2013, Until Discontinued, Normal    !! predniSONE (DELTASONE) 20 MG tablet 3 daily for 5 days, 2 daily for 5 days, 1 daily for 5 days., Normal     !! - Potential duplicate medications found. Please discuss with provider.      2.  Patient Education/Counseling:  The patient was given appropriate handouts, self care instructions, and instructed in symptomatic relief.   3.  Follow up:  The patient was told to follow up if no better in 3 to 4 days, if becoming worse in any way, and given some red flag symptoms such as worsening trouble breathing which would prompt immediate return.  Follow up here as needed.      Reuben Likes, MD 01/13/13 2119

## 2013-01-13 NOTE — ED Notes (Signed)
rx called to [facility] [address] at pt request

## 2013-04-17 ENCOUNTER — Emergency Department (HOSPITAL_COMMUNITY)
Admission: EM | Admit: 2013-04-17 | Discharge: 2013-04-17 | Discharge status: Absent without leave | Payer: Managed Care, Other (non HMO) | Source: Home / Self Care

## 2013-07-15 ENCOUNTER — Encounter (HOSPITAL_COMMUNITY): Payer: Self-pay | Admitting: Emergency Medicine

## 2013-07-15 ENCOUNTER — Emergency Department (HOSPITAL_COMMUNITY)
Admission: EM | Admit: 2013-07-15 | Discharge: 2013-07-15 | Disposition: A | Discharge status: Home / Self Care | Payer: Managed Care, Other (non HMO) | Attending: Emergency Medicine | Admitting: Emergency Medicine

## 2013-07-15 DIAGNOSIS — L0390 Cellulitis, unspecified: Secondary | ICD-10-CM

## 2013-07-15 DIAGNOSIS — M79609 Pain in unspecified limb: Secondary | ICD-10-CM

## 2013-07-15 DIAGNOSIS — L02419 Cutaneous abscess of limb, unspecified: Secondary | ICD-10-CM | POA: Insufficient documentation

## 2013-07-15 DIAGNOSIS — Z79899 Other long term (current) drug therapy: Secondary | ICD-10-CM | POA: Insufficient documentation

## 2013-07-15 DIAGNOSIS — K219 Gastro-esophageal reflux disease without esophagitis: Secondary | ICD-10-CM | POA: Insufficient documentation

## 2013-07-15 DIAGNOSIS — Z791 Long term (current) use of non-steroidal anti-inflammatories (NSAID): Secondary | ICD-10-CM | POA: Insufficient documentation

## 2013-07-15 DIAGNOSIS — L03119 Cellulitis of unspecified part of limb: Principal | ICD-10-CM

## 2013-07-15 DIAGNOSIS — J45909 Unspecified asthma, uncomplicated: Secondary | ICD-10-CM | POA: Insufficient documentation

## 2013-07-15 DIAGNOSIS — I10 Essential (primary) hypertension: Secondary | ICD-10-CM | POA: Insufficient documentation

## 2013-07-15 DIAGNOSIS — Z87448 Personal history of other diseases of urinary system: Secondary | ICD-10-CM | POA: Insufficient documentation

## 2013-07-15 LAB — CBC WITH DIFFERENTIAL/PLATELET
Basophils Absolute: 0 10*3/uL (ref 0.0–0.1)
Basophils Relative: 0 % (ref 0–1)
EOS PCT: 22 % — AB (ref 0–5)
Eosinophils Absolute: 1.1 10*3/uL — ABNORMAL HIGH (ref 0.0–0.7)
HCT: 36.4 % (ref 36.0–46.0)
Hemoglobin: 12.4 g/dL (ref 12.0–15.0)
LYMPHS PCT: 28 % (ref 12–46)
Lymphs Abs: 1.4 10*3/uL (ref 0.7–4.0)
MCH: 27.6 pg (ref 26.0–34.0)
MCHC: 34.1 g/dL (ref 30.0–36.0)
MCV: 80.9 fL (ref 78.0–100.0)
Monocytes Absolute: 0.3 10*3/uL (ref 0.1–1.0)
Monocytes Relative: 6 % (ref 3–12)
NEUTROS PCT: 44 % (ref 43–77)
Neutro Abs: 2.3 10*3/uL (ref 1.7–7.7)
Platelets: 243 10*3/uL (ref 150–400)
RBC: 4.5 MIL/uL (ref 3.87–5.11)
RDW: 12.8 % (ref 11.5–15.5)
WBC: 5.1 10*3/uL (ref 4.0–10.5)

## 2013-07-15 LAB — BASIC METABOLIC PANEL
BUN: 10 mg/dL (ref 6–23)
CO2: 26 meq/L (ref 19–32)
Calcium: 9.3 mg/dL (ref 8.4–10.5)
Chloride: 101 mEq/L (ref 96–112)
Creatinine, Ser: 0.99 mg/dL (ref 0.50–1.10)
GFR calc Af Amer: 81 mL/min — ABNORMAL LOW (ref >90)
GFR calc non Af Amer: 70 mL/min — ABNORMAL LOW (ref >90)
Glucose, Bld: 100 mg/dL — ABNORMAL HIGH (ref 70–99)
POTASSIUM: 3.1 meq/L — AB (ref 3.7–5.3)
SODIUM: 136 meq/L — AB (ref 137–147)

## 2013-07-15 MED ORDER — DOXYCYCLINE HYCLATE 100 MG PO TABS
100.0000 mg | ORAL_TABLET | Freq: Once | ORAL | Status: AC
  Administered 2013-07-15: 100 mg via ORAL
  Filled 2013-07-15: qty 1

## 2013-07-15 MED ORDER — DOXYCYCLINE HYCLATE 100 MG PO CAPS: 100.0000 mg | ORAL_CAPSULE | Freq: Two times a day (BID) | ORAL | Status: DC

## 2013-07-15 MED ORDER — NAPROXEN 500 MG PO TABS: 500.0000 mg | ORAL_TABLET | Freq: Two times a day (BID) | ORAL | Status: DC

## 2013-07-15 NOTE — Discharge Instructions (Signed)
Cellulitis Cellulitis is an infection of the skin and the tissue under the skin. The infected area is usually red and tender. This happens most often in the arms and lower legs. HOME CARE   Take your antibiotic medicine as told. Finish the medicine even if you start to feel better.  Keep the infected arm or leg raised (elevated).  Put a warm cloth on the area up to 4 times per day.  Only take medicines as told by your doctor.  Keep all doctor visits as told. GET HELP RIGHT AWAY IF:   You have a fever.  You feel very sleepy.  You throw up (vomit) or have watery poop (diarrhea).  You feel sick and have muscle aches and pains.  You see red streaks on the skin coming from the infected area.  Your red area gets bigger or turns a dark color.  Your bone or joint under the infected area is painful after the skin heals.  Your infection comes back in the same area or different area.  You have a puffy (swollen) bump in the infected area.  You have new symptoms. MAKE SURE YOU:   Understand these instructions.  Will watch your condition.  Will get help right away if you are not doing well or get worse. Document Released: 07/22/2007 Document Revised: 08/04/2011 Document Reviewed: 04/20/2011 [facility] Patient Information 2014 [place], [place].  Take the antibiotic as directed for the next 7 days. Take the Naprosyn for the inflammation. Make an appointment to followup with your vein clinic first part of the week for reevaluation. Return for any newer worse symptoms. Doppler study was negative for any deep vein thrombosis.

## 2013-07-15 NOTE — ED Notes (Signed)
Per pt sts since Wednesday she has been having RLE pain and swelling.

## 2013-07-15 NOTE — ED Notes (Signed)
Pt brought back to room; pt getting undressed and into a gown at this time; Erskine Squibb, EMT aware

## 2013-07-15 NOTE — ED Notes (Signed)
Tender, red and warm to touch

## 2013-07-15 NOTE — Progress Notes (Signed)
*  Preliminary Results* Right lower extremity venous duplex completed. Right lower extremity is negative for deep vein thrombosis. There is no evidence of right Baker's cyst.  07/15/2013 5:02 PM  Gertie Fey, RVT, RDCS, RDMS

## 2013-07-15 NOTE — ED Provider Notes (Signed)
CSN: [id number]     Arrival date & time 07/15/13  1447 History   First MD Initiated Contact with Patient 07/15/13 1544     Chief Complaint  Patient presents with  . Leg Pain     (Consider location/radiation/quality/duration/timing/severity/associated sxs/prior Treatment) Patient is a 43 y.o. female presenting with leg pain. The history is provided by the patient.  Leg Pain Associated symptoms: no back pain and no fever    patient is currently undergoing of injections for superficial varicose veins. Sitting done by pain specialist. Patient had an injection done on Friday and on Wednesday started with redness at the injection site. The redness has spread some. Associated with warmth and some discomfort. No new shortness of breath no chest pain. No calf pain. Patient is concerned about a blood clot in the deep veins. Patient states that the pain is about 4/10. No fevers.  Past Medical History  Diagnosis Date  . Hypertension   . GERD (gastroesophageal reflux disease)     CONTROLLED W/ PROTONIX  . SUI (stress urinary incontinence, female)   . Asthma    Past Surgical History  Procedure Laterality Date  . Tubal ligation  2004  . Cholecystectomy  2006  . Nasal sinus surgery  X2  (LAST ONE 2008)  . Pubovaginal sling  01/05/2011    Procedure: Leonides Grills;  Surgeon: Garnett Farm, MD;  Location: [facility];  Service: Urology;  Laterality: N/A;  pubo- vaginal sling   Family History  Problem Relation Age of Onset  . Allergies Mother   . Asthma Mother   . Hypertension Mother   . Diabetes Mother    History  Substance Use Topics  . Smoking status: Never Smoker   . Smokeless tobacco: Never Used  . Alcohol Use: No   OB History   Grav Para Term Preterm Abortions TAB SAB Ect Mult Living                 Review of Systems  Constitutional: Negative for fever.  HENT: Negative for congestion.   Eyes: Negative for visual disturbance.  Respiratory: Negative for  shortness of breath.   Cardiovascular: Negative for chest pain.  Gastrointestinal: Negative for abdominal pain.  Genitourinary: Negative for dysuria.  Musculoskeletal: Negative for back pain.  Skin: Positive for color change.  Neurological: Negative for headaches.  Hematological: Does not bruise/bleed easily.  Psychiatric/Behavioral: Negative for confusion.      Allergies  Sulfonamide derivatives  Home Medications   Prior to Admission medications   Medication Sig Start Date End Date Taking? Authorizing Provider  albuterol (PROVENTIL HFA;VENTOLIN HFA) 108 (90 BASE) MCG/ACT inhaler Inhale 2 puffs into the lungs every 6 (six) hours as needed for wheezing or shortness of breath.    Yes Historical Provider, MD  budesonide-formoterol (SYMBICORT) 160-4.5 MCG/ACT inhaler Inhale 2 puffs into the lungs 2 (two) times daily.     Yes Historical Provider, MD  fexofenadine (ALLEGRA) 180 MG tablet Take 180 mg by mouth daily as needed for allergies.    Yes Historical Provider, MD  hydrochlorothiazide (HYDRODIURIL) 25 MG tablet Take 25 mg by mouth daily.   Yes Historical Provider, MD  losartan (COZAAR) 50 MG tablet Take 50 mg by mouth daily.   Yes Historical Provider, MD  Multiple Vitamins-Minerals (MULTIVITAMIN PO) Take 1 tablet by mouth daily.   Yes Historical Provider, MD  pantoprazole (PROTONIX) 40 MG tablet Take 40 mg by mouth every evening.     Yes Historical Provider, MD  sodium chloride (OCEAN) 0.65 % SOLN nasal spray Place 1 spray into both nostrils daily as needed for congestion.   Yes Historical Provider, MD  doxycycline (VIBRAMYCIN) 100 MG capsule Take 1 capsule (100 mg total) by mouth 2 (two) times daily. 07/15/13   Vanetta MuldersScott Brennden Masten, MD  naproxen (NAPROSYN) 500 MG tablet Take 1 tablet (500 mg total) by mouth 2 (two) times daily. 07/15/13   Vanetta MuldersScott Dayson Aboud, MD   BP 123/88  Pulse 89  Temp(Src) 98.4 F (36.9 C) (Oral)  Resp 16  SpO2 97%  LMP 06/24/2013 Physical Exam  Nursing note and  vitals reviewed. Constitutional: She is oriented to person, place, and time. She appears well-developed and well-nourished. No distress.  HENT:  Head: Normocephalic and atraumatic.  Mouth/Throat: Oropharynx is clear and moist.  Eyes: Conjunctivae and EOM are normal. Pupils are equal, round, and reactive to light.  Neck: Normal range of motion.  Cardiovascular: Normal rate and regular rhythm.   Pulmonary/Chest: Effort normal and breath sounds normal. No respiratory distress.  Abdominal: Soft. Bowel sounds are normal. There is no tenderness.  Musculoskeletal: Normal range of motion. She exhibits edema and tenderness.  Right inner lower leg with an area of erythema measuring about 6 cm. Increased warmth some mild tenderness. Mild edema. Dorsalis pedis pulses 2+. No calf tenderness. Sensation intact good range of motion of the toes.  Neurological: She is alert and oriented to person, place, and time. No cranial nerve deficit. She exhibits normal muscle tone. Coordination normal.  Skin: Skin is warm. There is erythema.    ED Course  Procedures (including critical care time) Labs Review Labs Reviewed  CBC WITH DIFFERENTIAL - Abnormal; Notable for the following:    Eosinophils Relative 22 (*)    Eosinophils Absolute 1.1 (*)    All other components within normal limits  BASIC METABOLIC PANEL - Abnormal; Notable for the following:    Sodium 136 (*)    Potassium 3.1 (*)    Glucose, Bld 100 (*)    GFR calc non Af Amer 70 (*)    GFR calc Af Amer 81 (*)    All other components within normal limits   Results for orders placed during the hospital encounter of 07/15/13  CBC WITH DIFFERENTIAL      Result Value Ref Range   WBC 5.1  4.0 - 10.5 K/uL   RBC 4.50  3.87 - 5.11 MIL/uL   Hemoglobin 12.4  12.0 - 15.0 g/dL   HCT 03.436.4  74.236.0 - 59.546.0 %   MCV 80.9  78.0 - 100.0 fL   MCH 27.6  26.0 - 34.0 pg   MCHC 34.1  30.0 - 36.0 g/dL   RDW 63.812.8  75.611.5 - 43.315.5 %   Platelets 243  150 - 400 K/uL    Neutrophils Relative % 44  43 - 77 %   Neutro Abs 2.3  1.7 - 7.7 K/uL   Lymphocytes Relative 28  12 - 46 %   Lymphs Abs 1.4  0.7 - 4.0 K/uL   Monocytes Relative 6  3 - 12 %   Monocytes Absolute 0.3  0.1 - 1.0 K/uL   Eosinophils Relative 22 (*) 0 - 5 %   Eosinophils Absolute 1.1 (*) 0.0 - 0.7 K/uL   Basophils Relative 0  0 - 1 %   Basophils Absolute 0.0  0.0 - 0.1 K/uL  BASIC METABOLIC PANEL      Result Value Ref Range   Sodium 136 (*) 137 - 147 mEq/L  Potassium 3.1 (*) 3.7 - 5.3 mEq/L   Chloride 101  96 - 112 mEq/L   CO2 26  19 - 32 mEq/L   Glucose, Bld 100 (*) 70 - 99 mg/dL   BUN 10  6 - 23 mg/dL   Creatinine, Ser 4.78  0.50 - 1.10 mg/dL   Calcium 9.3  8.4 - 29.5 mg/dL   GFR calc non Af Amer 70 (*) >90 mL/min   GFR calc Af Amer 81 (*) >90 mL/min     Imaging Review No results found.   EKG Interpretation None      MDM   Final diagnoses:  Cellulitis    Patient's Doppler study negative for deep vein thrombosis. Clinically seems to be consistent with a cellulitis probably due to the injections for the superficial varicose veins. Suspected she developed a cellulitis or inflammatory changes from this. She's been through several of these procedures has had not had this happen before. We'll treat that with doxycycline and have her follow up with the pain clinic. Patient will return for a newer worse symptoms.    Vanetta Mulders, MD 07/15/13 [phone]

## 2013-10-04 ENCOUNTER — Other Ambulatory Visit: Payer: Self-pay

## 2013-10-04 DIAGNOSIS — Z1231 Encounter for screening mammogram for malignant neoplasm of breast: Secondary | ICD-10-CM

## 2013-10-30 ENCOUNTER — Ambulatory Visit
Admission: RE | Admit: 2013-10-30 | Discharge: 2013-10-30 | Disposition: A | Discharge status: Home / Self Care | Payer: 59 | Source: Ambulatory Visit

## 2013-10-30 DIAGNOSIS — Z1231 Encounter for screening mammogram for malignant neoplasm of breast: Secondary | ICD-10-CM

## 2013-12-01 ENCOUNTER — Encounter: Payer: Self-pay | Admitting: Podiatrist

## 2013-12-01 ENCOUNTER — Ambulatory Visit (INDEPENDENT_AMBULATORY_CARE_PROVIDER_SITE_OTHER): Payer: 59 | Admitting: Podiatrist

## 2013-12-01 VITALS — BP 132/78 | HR 75 | Resp 16

## 2013-12-01 DIAGNOSIS — B351 Tinea unguium: Secondary | ICD-10-CM

## 2013-12-01 NOTE — Patient Instructions (Signed)
I am calling you in a medication called Jublia to a pharmacy that will be able to get you the best price on this particular medication-- it is called [facility].  They will call to confirm that you are interested in the topical therapy-- they are located out of [place].  If you do not hear from them within 2-3 days, please let our office know.  You apply the topical to unpolished nails daily.    

## 2013-12-01 NOTE — Progress Notes (Signed)
   Subjective:    Patient ID: Rachael Calderon, female    DOB: [birth year], 43 y.o.   MRN: [id number]  HPI Pt presents with right great toenail discoloration/fungus, has used vinegar to try to treat, pt states discoloration has been there since September. She also c/o foot cramps that wake her up at night, she states that this has been going on for several months, and is occuring more often   Review of Systems  HENT: Positive for rhinorrhea.   Cardiovascular: Positive for leg swelling.       Objective:   Physical Exam  Patient is awake, alert, and oriented x 3.  In no acute distress.  Vascular status is intact with palpable pedal pulses at 2/4 DP and PT bilateral and capillary refill time within normal limits. Neurological sensation is also intact bilaterally via Semmes Weinstein monofilament at 5/5 sites. Light touch, vibratory sensation, Achilles tendon reflex is intact. Dermatological exam reveals skin color, turger and texture as normal. No open lesions present.  Musculature intact with dorsiflexion, plantarflexion, inversion, eversion.  Right hallux nail is discolored. It is mildly dystrophic as well. Friable and brittle. Dystrophy versus onychomycosis is favored. All other digital nails are asymptomatic. Patient also relates generalized cramping in the evenings of feet ankles and lower legs.   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 We'll start her on topical antifungal medication of Jublia. We'll consider oral medication after 3 months of topical and based on the culture results. Recommended tonic water for the cramping. If this is not beneficial we'll consider a course of Valium. We will call with the result of the culture.

## 2013-12-26 ENCOUNTER — Telehealth: Payer: Self-pay | Admitting: *Deleted

## 2013-12-26 NOTE — Telephone Encounter (Signed)
I had an appointment last month, October the 16th.  I never heard anything back.  I never got the polish for my fungus on my toenails.  Would someone give me a call and give me an update?

## 2013-12-27 ENCOUNTER — Other Ambulatory Visit: Payer: Self-pay | Admitting: Podiatrist

## 2013-12-27 MED ORDER — TAVABOROLE 5 % EX SOLN: 1.0000 [drp] | CUTANEOUS | Status: AC

## 2013-12-27 NOTE — Telephone Encounter (Signed)
Yes, I will call in a medication for her toenail fungus.

## 2013-12-27 NOTE — Telephone Encounter (Signed)
I was seen there October 16, somewhere in there.  I was supposed to have received a prescription for toe fungus.  I have not received it.  I don't know what's going on.  If someone would give me a call.  Thank you.

## 2013-12-28 NOTE — Telephone Encounter (Signed)
I called and left her a message that Dr. Irving ShowsEgerton sent a prescription yesterday to [facility] on [address]  I apologized for any inconvenience this delay may have caused.  Call if you have any further questions.

## 2013-12-29 ENCOUNTER — Telehealth: Payer: Self-pay | Admitting: *Deleted

## 2013-12-29 NOTE — Telephone Encounter (Signed)
I was seen back in October.  I think I see Dr.Egerton if I'm not mistaken.  There was a message left on my machine that a prescription was sent in to [facility] on [address]  I went there, I called several times to verify if the medicine was there.  It was not sent there or any of the [facility].  If you guys could clear up what's going on.  I think I been calling a week or so trying to get this resolved.  I called and left her a message that I do apologize.  The [facility] was not [facility] [address]  It was sent to a pharmacy called [facility].  They [place].  They may call you for insurance verification.  They will deliver it directly to your home.  If you have any questions, give us a call on Monday.

## 2014-01-05 ENCOUNTER — Telehealth: Payer: Self-pay | Admitting: *Deleted

## 2014-01-05 NOTE — Telephone Encounter (Signed)
Patient returned my call.  I informed her that Dr. Irving ShowsEgerton said the culture was negative for fungus.  Continue using the topical.  "Okay, well what's wrong with my toe?  It hurts.  Okay I will keep using it."  I asked her to call back and schedule an appointment in 2-3 months if it hasn't improved any.  Sometime we have to do another culture due to false negatives results.  "Okay, I will."

## 2014-01-05 NOTE — Telephone Encounter (Signed)
I left messages for the patient to call me back.  I need to inform her that Dr. Irving ShowsEgerton said her culture was negative for fungus.  Continue to use the topical and see if there is any improvement.

## 2014-01-16 ENCOUNTER — Other Ambulatory Visit: Payer: Self-pay | Admitting: Family Medicine

## 2014-01-16 ENCOUNTER — Other Ambulatory Visit (HOSPITAL_COMMUNITY)
Admission: RE | Admit: 2014-01-16 | Discharge: 2014-01-16 | Disposition: A | Discharge status: Home / Self Care | Payer: 59 | Source: Ambulatory Visit | Attending: Family Medicine | Admitting: Family Medicine

## 2014-01-16 DIAGNOSIS — Z1151 Encounter for screening for human papillomavirus (HPV): Secondary | ICD-10-CM | POA: Insufficient documentation

## 2014-01-16 DIAGNOSIS — Z124 Encounter for screening for malignant neoplasm of cervix: Secondary | ICD-10-CM | POA: Diagnosis present

## 2014-01-17 LAB — CYTOLOGY - PAP

## 2014-01-23 ENCOUNTER — Encounter: Payer: Self-pay | Admitting: Podiatrist

## 2014-12-10 ENCOUNTER — Other Ambulatory Visit: Payer: Self-pay | Admitting: Family Medicine

## 2014-12-10 DIAGNOSIS — Z1231 Encounter for screening mammogram for malignant neoplasm of breast: Secondary | ICD-10-CM

## 2014-12-27 ENCOUNTER — Ambulatory Visit: Payer: Managed Care, Other (non HMO)

## 2015-01-28 ENCOUNTER — Ambulatory Visit
Admission: RE | Admit: 2015-01-28 | Discharge: 2015-01-28 | Disposition: A | Discharge status: Home / Self Care | Payer: 59 | Source: Ambulatory Visit | Attending: Family Medicine | Admitting: Family Medicine

## 2015-01-28 DIAGNOSIS — Z1231 Encounter for screening mammogram for malignant neoplasm of breast: Secondary | ICD-10-CM

## 2015-03-18 ENCOUNTER — Other Ambulatory Visit: Payer: Self-pay | Admitting: Family Medicine

## 2015-03-18 DIAGNOSIS — N926 Irregular menstruation, unspecified: Secondary | ICD-10-CM

## 2015-04-19 ENCOUNTER — Ambulatory Visit
Admission: RE | Admit: 2015-04-19 | Discharge: 2015-04-19 | Disposition: A | Discharge status: Home / Self Care | Payer: 59 | Source: Ambulatory Visit | Attending: Family Medicine | Admitting: Family Medicine

## 2015-04-19 DIAGNOSIS — N926 Irregular menstruation, unspecified: Secondary | ICD-10-CM

## 2016-06-03 IMAGING — US US TRANSVAGINAL NON-OB
1 series · 14 of 25 positions shown · non-contrast
Comparison: None

CLINICAL DATA: Irregular menses

EXAM:
TRANSABDOMINAL AND TRANSVAGINAL ULTRASOUND OF PELVIS
TECHNIQUE: Both transabdominal and transvaginal ultrasound examinations of the
pelvis were performed. Transabdominal technique was performed for
global imaging of the pelvis including uterus, ovaries, adnexal
regions, and pelvic cul-de-sac. It was necessary to proceed with
endovaginal exam following the transabdominal exam to visualize the
bilateral ovaries.

[Series 1: us transvaginal non-ob · 0.32mm/px · 14 of 61 slices shown]
[im 1/61]
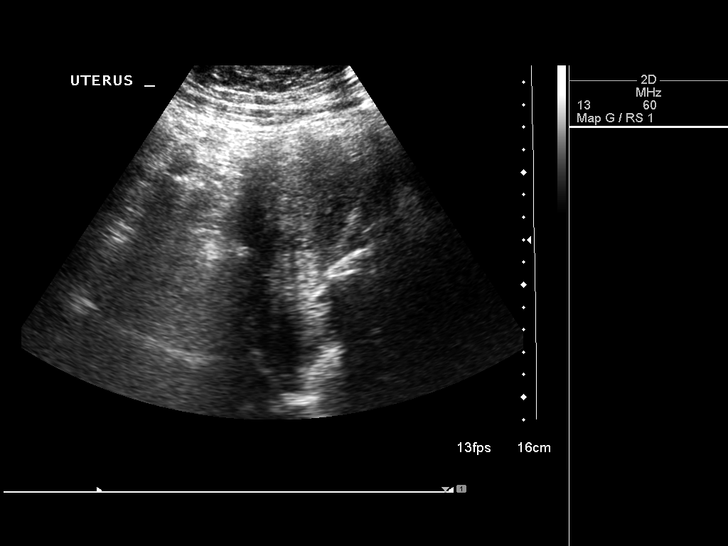
[im 6/61]
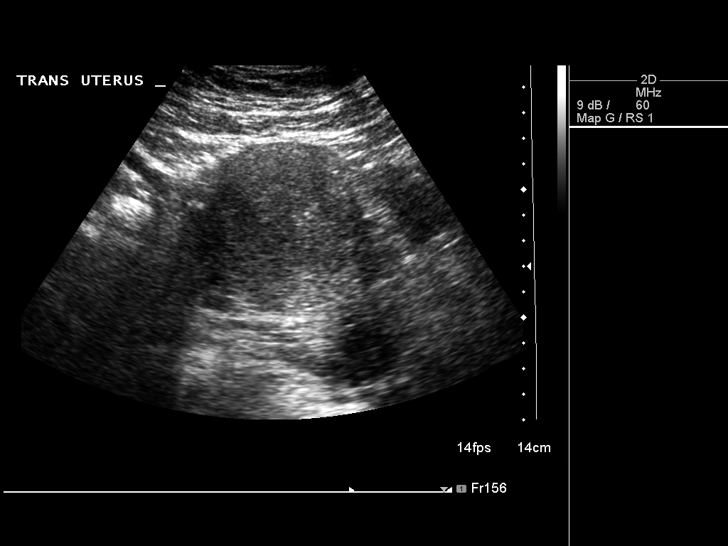
[im 11/61]
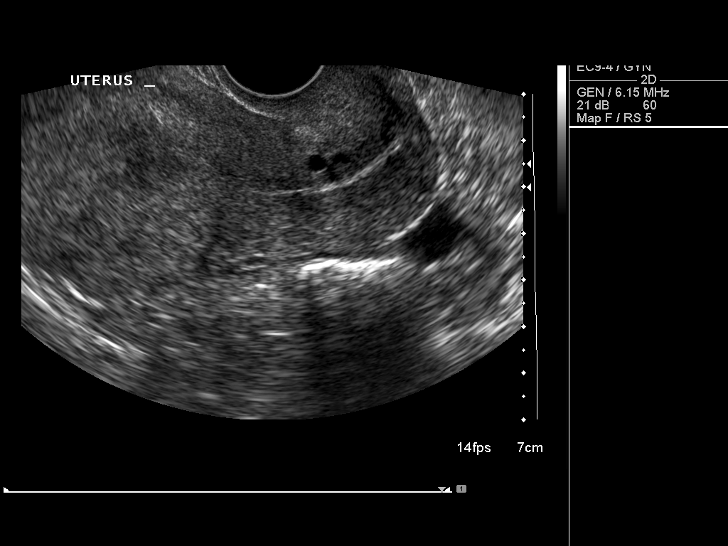
[im 16/61]
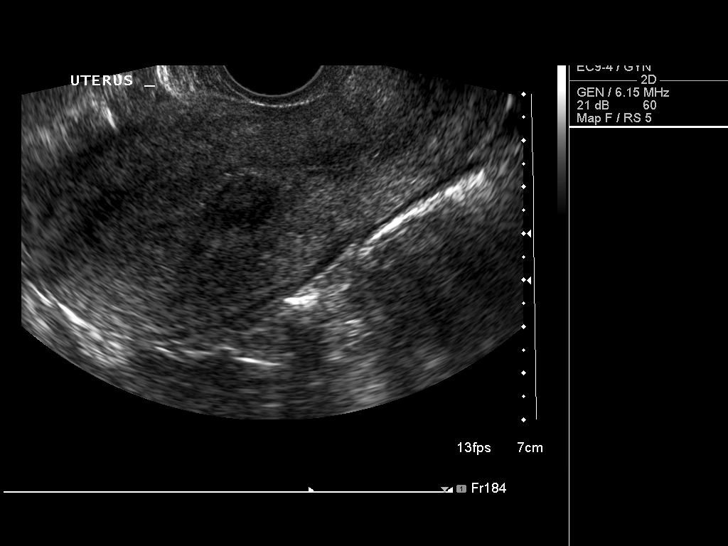
[im 21/61]
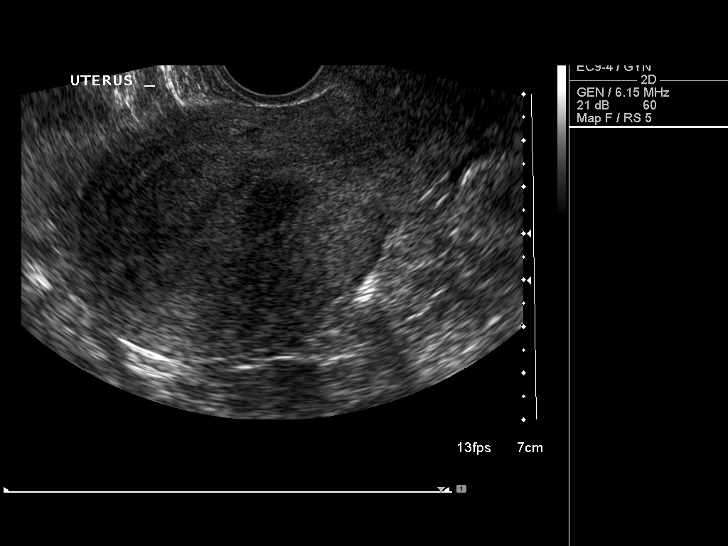
[im 23/61]
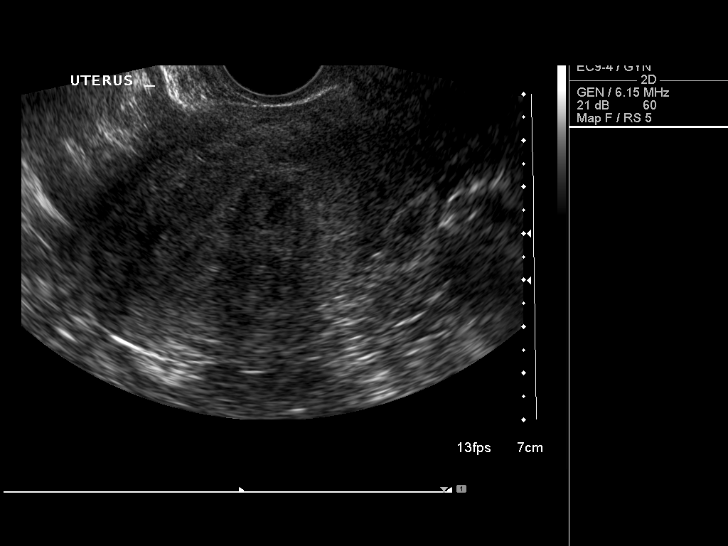
[im 28/61]
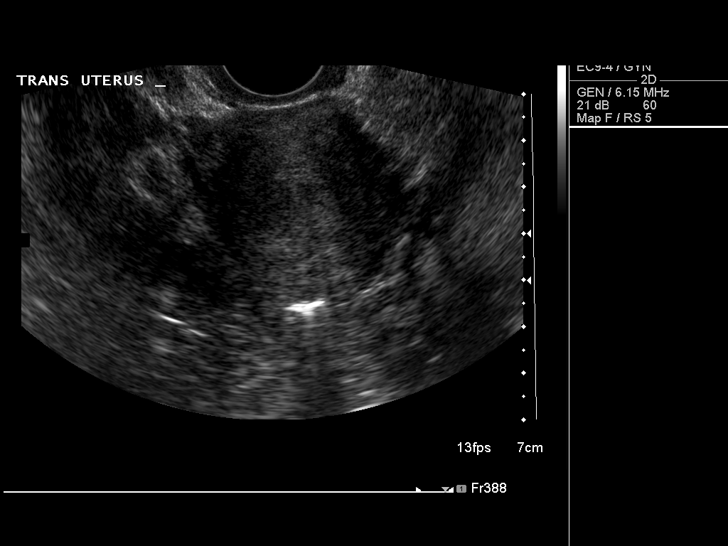
[im 33/61]
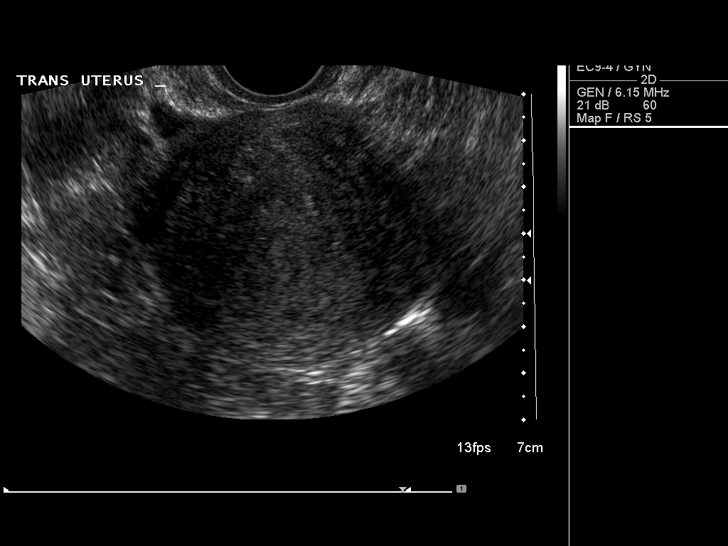
[im 38/61]
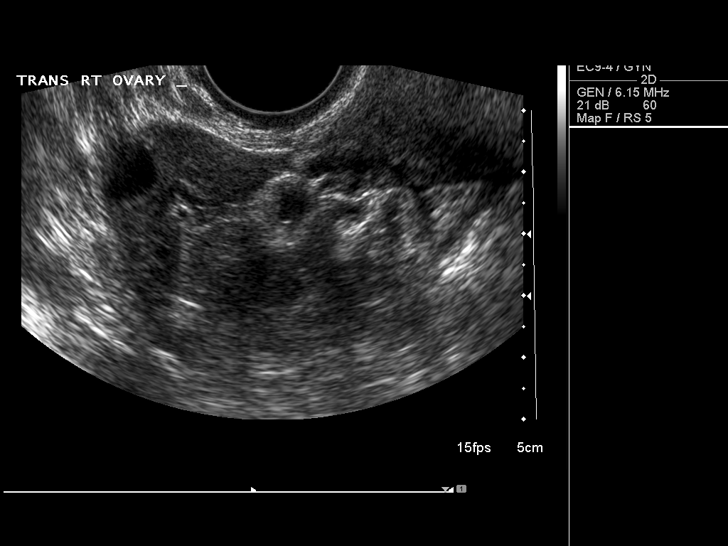
[im 41/61]
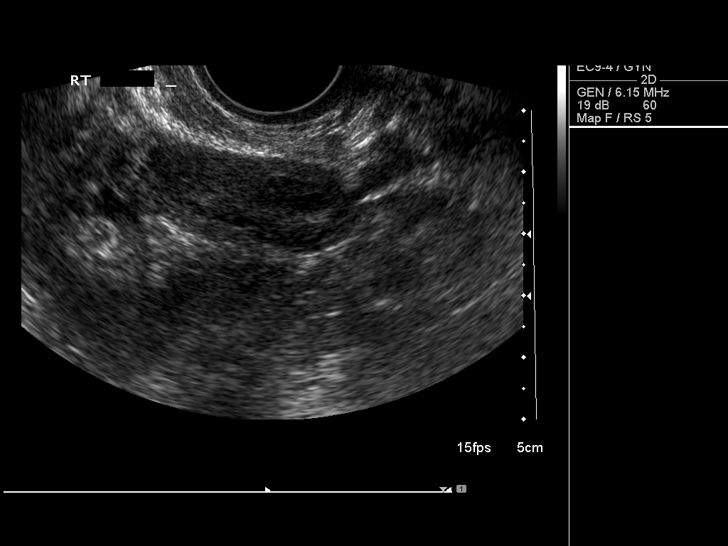
[im 46/61]
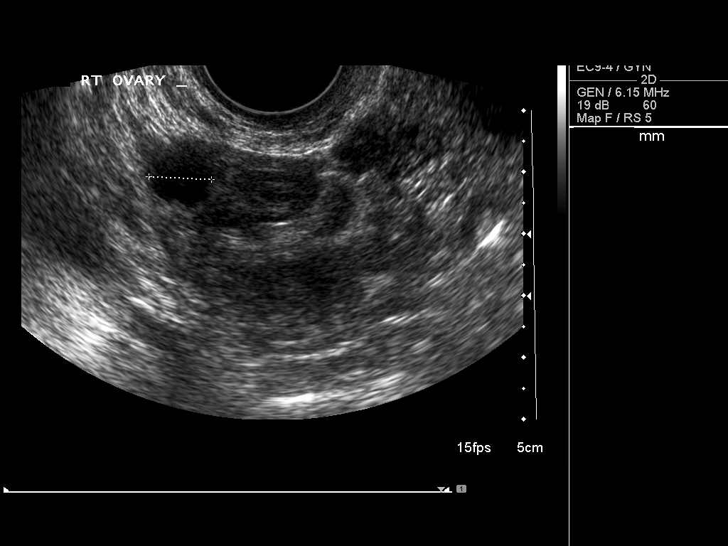
[im 51/61]
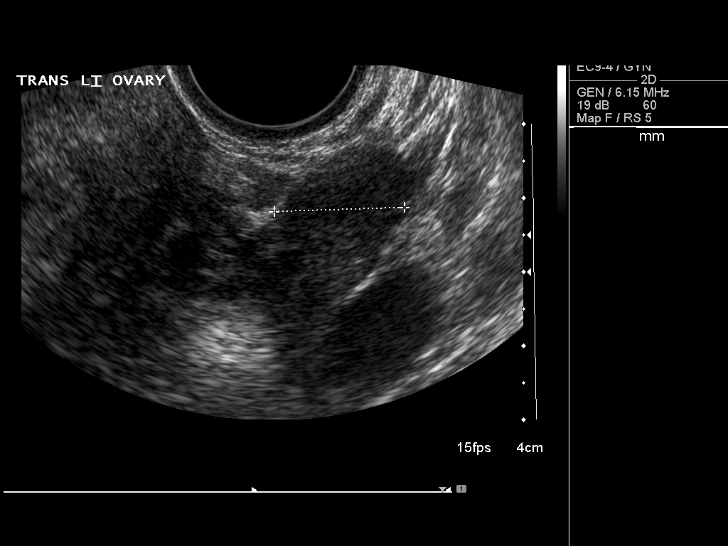
[im 56/61]
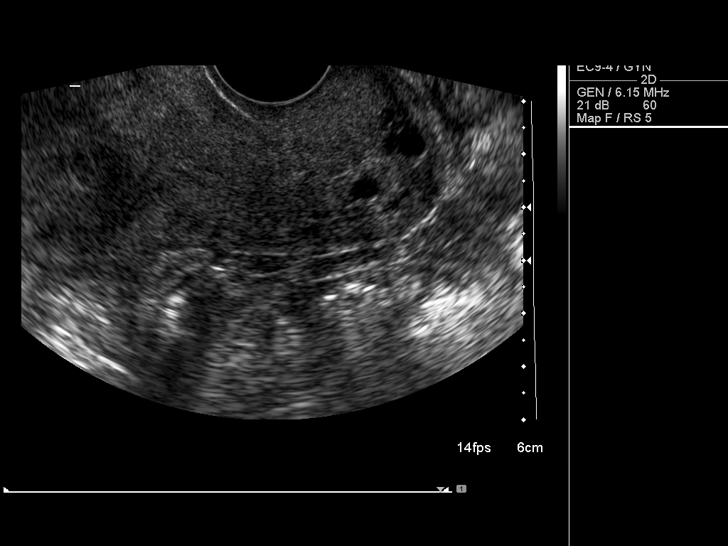
[im 61/61]
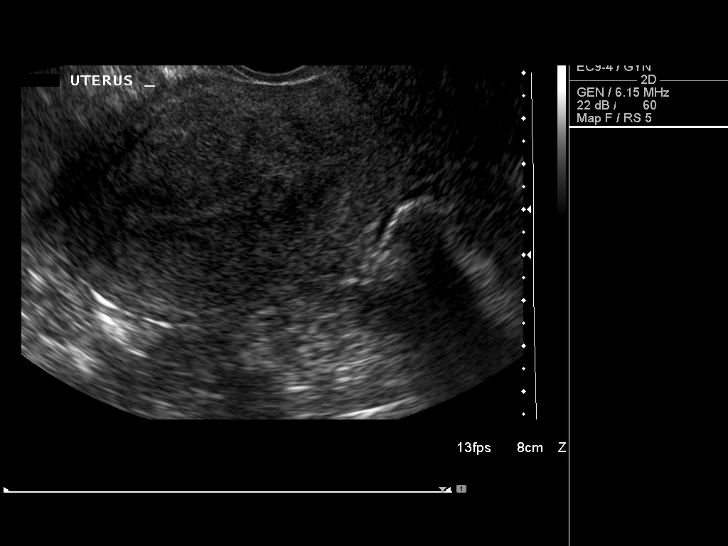

[14 of 25 positions shown; findings below may reference images not displayed]

FINDINGS: Uterus

Measurements: 10.6 x 5.2 x 6.4 cm. 16 x 12 x 12 mm probable
submucosal fibroid along the posterior uterine body.

Endometrium

Thickness: 14 mm. 16 mm lesion noted above could reflect an
endometrial polyp but is favored to reflect a fibroid.

Right ovary

Measurements: 3.4 x 2.1 x 2.8 cm. 16 x 12 x 10 mm hemorrhagic cyst,
physiologic.

Left ovary

Measurements: 2.9 x 1.9 x 1.8 cm. Normal appearance/no adnexal mass.

Other findings

Trace pelvic ascites, physiologic.
IMPRESSION: 16 x 12 x 12 mm probable submucosal fibroid along the posterior
uterine body, less likely an endometrial polyp.

Consider hysteroscopy and/or endometrial sampling as clinically
warranted.

## 2017-04-09 ENCOUNTER — Other Ambulatory Visit: Payer: Self-pay | Admitting: Family Medicine

## 2017-04-09 ENCOUNTER — Other Ambulatory Visit (HOSPITAL_COMMUNITY)
Admission: RE | Admit: 2017-04-09 | Discharge: 2017-04-09 | Disposition: A | Discharge status: Home / Self Care | Payer: Managed Care, Other (non HMO) | Source: Ambulatory Visit | Attending: Family Medicine | Admitting: Family Medicine

## 2017-04-09 DIAGNOSIS — Z124 Encounter for screening for malignant neoplasm of cervix: Secondary | ICD-10-CM | POA: Diagnosis not present

## 2017-04-13 LAB — CYTOLOGY - PAP
Diagnosis: NEGATIVE
HPV: NOT DETECTED

## 2018-04-26 ENCOUNTER — Encounter: Payer: Managed Care, Other (non HMO) | Attending: Family Medicine | Admitting: Registered"

## 2018-04-26 ENCOUNTER — Encounter: Payer: Self-pay | Admitting: Registered"

## 2018-04-26 DIAGNOSIS — R7303 Prediabetes: Secondary | ICD-10-CM | POA: Insufficient documentation

## 2018-04-26 NOTE — Patient Instructions (Addendum)
-   Aim to increase physical activity to at least 15 min, 2 days/week. Make walking plan with husband.   - Aim to increase non-starchy vegetables with lunch and dinner.   - Increase fiber intake with whole grains, nuts, fruit, and vegetables.   - Aim to have 3 meals a day.   - Balance snacks with carbohydrates and protein.

## 2018-04-26 NOTE — Progress Notes (Signed)
  Medical Nutrition Therapy:  Appt start time: 10:05 end time:  11:00.   Assessment:  Primary concerns today: Prediabetes. Recent  A1c increased from 5.9 to 6.1, per referral.  Pt expectations: wants to eat better, lose some weight  Medical history: hypertension Pt arrives stating she was told she needed to see a nutritionist or start taking metformin; prefers not to take more medication. Pt states she typically sleeps about 5 hours/day; works 3rd shift. Pt states she knows she needs to do better and will likely begin with more joyful movement. Pt states she enjoys dancing and brisk walking. States there is a gym at work for employee use.   Pt states she works 3rd shift 7 pm - 7 am, 4 - 5 days/week. Pt states she may or may not eat after work. Pt reports averaging about 1-2 meals a day; does not eat before going to work at night. Snacks throughout the day.    Preferred Learning Style:   No preference indicated   Learning Readiness:   Ready  Change in progress   MEDICATIONS: See list   DIETARY INTAKE:  Usual eating pattern includes 1-2 meals and 1-3 snacks per day.  Everyday foods include fast food, vegetables, meat, potatoes.  Avoided foods include none stated.    24-hr recall:  B ( AM): sometimes skips; fast food-biscuit or omelette (mushroom + spinach) about 2x/week Snk ( AM): chips or fruit  L ( PM): skips; sometimes peanut butter + banana sandwich Snk ( PM) D (10-2 AM): roast + mashed potatoes + green beans Snk ( PM): banana + chips  Beverages: sweet tea, kool-aid, water (32-40 oz/day), Mountain Dew/Pepsi (rarely)  Usual physical activity: none stated  Estimated energy needs: 1800 calories 200 g carbohydrates 135 g protein 50 g fat  Progress Towards Goal(s):  In progress.   Nutritional Diagnosis:  NB-1.1 Food and nutrition-related knowledge deficit As related to prediabetes.  As evidenced by verbalizes incomplete information.    Intervention:  Nutrition was  educated and counseled on prediabetes, My Plate, fiber, eating fuel the body, and importance of physical activity. Pt was in agreement with goals listed.  Goals: - Aim to increase physical activity to at least 15 min, 2 days/week. Make walking plan with husband.  - Aim to increase non-starchy vegetables with lunch and dinner.  - Increase fiber intake with whole grains, nuts, fruit, and vegetables.  - Aim to have 3 meals a day.  - Balance snacks with carbohydrates and protein.   Teaching Method Utilized:  Visual Auditory Hands on  Handouts given during visit include:  My Plate for diabetes  Barriers to learning/adherence to lifestyle change: work-life balance, contemplative stage of change  Demonstrated degree of understanding via:  Teach Back   Monitoring/Evaluation:  Dietary intake, exercise, and body weight in 1 month(s).

## 2018-06-01 ENCOUNTER — Ambulatory Visit: Payer: Self-pay | Admitting: Registered"

## 2018-08-18 ENCOUNTER — Telehealth: Payer: Self-pay | Admitting: Gastroenterology

## 2018-08-18 NOTE — Telephone Encounter (Signed)
Changed appt to in person appt. Patient confirmed and verbalized understanding.

## 2018-08-31 ENCOUNTER — Other Ambulatory Visit: Payer: Self-pay | Admitting: Internal Medicine

## 2018-08-31 ENCOUNTER — Telehealth: Payer: Self-pay

## 2018-08-31 DIAGNOSIS — Z20822 Contact with and (suspected) exposure to covid-19: Secondary | ICD-10-CM

## 2018-08-31 NOTE — Telephone Encounter (Signed)
Covid-19 screening questions   Do you now or have you had a fever in the last 14 days? no  Do you have any respiratory symptoms of shortness of breath or cough now or in the last 14 days? no  Do you have any family members or close contacts with diagnosed or suspected Covid-19 in the past 14 days? no  Have you been tested for Covid-19 and found to be positive? No        

## 2018-09-02 ENCOUNTER — Ambulatory Visit: Payer: Managed Care, Other (non HMO) | Admitting: Gastroenterology

## 2018-09-04 LAB — NOVEL CORONAVIRUS, NAA: SARS-CoV-2, NAA: NOT DETECTED

## 2019-04-24 DIAGNOSIS — I10 Essential (primary) hypertension: Secondary | ICD-10-CM | POA: Insufficient documentation

## 2019-04-24 DIAGNOSIS — R7303 Prediabetes: Secondary | ICD-10-CM | POA: Insufficient documentation

## 2019-04-24 DIAGNOSIS — J4521 Mild intermittent asthma with (acute) exacerbation: Secondary | ICD-10-CM | POA: Insufficient documentation

## 2019-04-24 DIAGNOSIS — E782 Mixed hyperlipidemia: Secondary | ICD-10-CM | POA: Insufficient documentation

## 2019-04-24 DIAGNOSIS — E559 Vitamin D deficiency, unspecified: Secondary | ICD-10-CM | POA: Insufficient documentation

## 2019-04-24 DIAGNOSIS — K219 Gastro-esophageal reflux disease without esophagitis: Secondary | ICD-10-CM | POA: Insufficient documentation

## 2020-11-29 ENCOUNTER — Encounter (HOSPITAL_COMMUNITY): Payer: Self-pay

## 2020-11-29 ENCOUNTER — Ambulatory Visit (HOSPITAL_COMMUNITY)
Admission: EM | Admit: 2020-11-29 | Discharge: 2020-11-29 | Disposition: A | Discharge status: Home / Self Care | Payer: Managed Care, Other (non HMO) | Attending: Urgent Care | Admitting: Urgent Care

## 2020-11-29 ENCOUNTER — Ambulatory Visit (INDEPENDENT_AMBULATORY_CARE_PROVIDER_SITE_OTHER): Payer: Managed Care, Other (non HMO)

## 2020-11-29 DIAGNOSIS — R937 Abnormal findings on diagnostic imaging of other parts of musculoskeletal system: Secondary | ICD-10-CM | POA: Diagnosis not present

## 2020-11-29 DIAGNOSIS — S93402A Sprain of unspecified ligament of left ankle, initial encounter: Secondary | ICD-10-CM | POA: Diagnosis not present

## 2020-11-29 MED ORDER — HYDROCODONE-ACETAMINOPHEN 5-325 MG PO TABS: 1.0000 | ORAL_TABLET | Freq: Four times a day (QID) | ORAL | 0 refills | Status: AC | PRN

## 2020-11-29 MED ORDER — NAPROXEN 500 MG PO TABS: 500.0000 mg | ORAL_TABLET | Freq: Two times a day (BID) | ORAL | 0 refills | Status: AC

## 2020-11-29 NOTE — ED Triage Notes (Signed)
Pt presents with swelling and pain in the left ankle. Reports she fell and twisted left ankle this afternoon. Pt took ibuprofen around 1845 today.

## 2020-11-29 NOTE — ED Notes (Signed)
Orhto tech states in a procedure now. Pt requesting ace wrap and f/u with ortho. Sarah PA notified.

## 2020-11-29 NOTE — ED Notes (Signed)
Called ortho tech, states will be here shortly. Pt notified.

## 2020-11-29 NOTE — ED Provider Notes (Signed)
Pt asked to leave prior to getting splint which was ordered by her provider Urban Gibson as she had been waiting over 1 hour for orthotech. I reviewed x ray which did not show bony abnormality. Pt prefers ACE wrap and crutches. She will be non-weight bearing and follow up with orthopedics.    Rushie Chestnut, Cordelia Poche 11/29/20 2119

## 2020-11-29 NOTE — ED Notes (Signed)
Pt notified waiting for ortho tech to apply splint.

## 2020-11-29 NOTE — ED Provider Notes (Signed)
[facility]   MRN: [id number] DOB: Oct 13, 1970  Subjective:   Rachael Calderon is a 50 y.o. female presenting for suffering an accidental fall today, twisted her left ankle.  Has had persistent left ankle pain and foot pain.  Cannot bear weight on her foot.  Has been walking around with crutches.  Use ibuprofen earlier today.  No current facility-administered medications for this encounter.  Current Outpatient Medications:    albuterol (PROVENTIL HFA;VENTOLIN HFA) 108 (90 BASE) MCG/ACT inhaler, Inhale 2 puffs into the lungs every 6 (six) hours as needed for wheezing or shortness of breath. , Disp: , Rfl:    atorvastatin (LIPITOR) 10 MG tablet, Take 10 mg by mouth daily., Disp: , Rfl:    budesonide-formoterol (SYMBICORT) 160-4.5 MCG/ACT inhaler, Inhale 2 puffs into the lungs 2 (two) times daily.  , Disp: , Rfl:    cholecalciferol (VITAMIN D3) 25 MCG (1000 UT) tablet, Take 1,000 Units by mouth daily., Disp: , Rfl:    diltiazem (CARDIZEM) 120 MG tablet, Take 120 mg by mouth 4 (four) times daily., Disp: , Rfl:    fexofenadine (ALLEGRA) 180 MG tablet, Take 180 mg by mouth daily as needed for allergies. , Disp: , Rfl:    hydrochlorothiazide (HYDRODIURIL) 25 MG tablet, Take 25 mg by mouth daily., Disp: , Rfl:    losartan (COZAAR) 50 MG tablet, Take 50 mg by mouth daily., Disp: , Rfl:    Mepolizumab (NUCALA) 100 MG/ML SOAJ, Inject into the skin., Disp: , Rfl:    Multiple Vitamins-Minerals (MULTIVITAMIN PO), Take 1 tablet by mouth daily., Disp: , Rfl:    naproxen (NAPROSYN) 500 MG tablet, Take 1 tablet (500 mg total) by mouth 2 (two) times daily. (Patient not taking: No sig reported), Disp: 14 tablet, Rfl: 0   pantoprazole (PROTONIX) 40 MG tablet, Take 40 mg by mouth every evening.  , Disp: , Rfl:    potassium chloride (K-DUR) 10 MEQ tablet, Take 10 mEq by mouth daily., Disp: , Rfl:    Probiotic Product (PROBIOTIC-10 PO), Take by mouth., Disp: , Rfl:    sodium chloride (OCEAN)  0.65 % SOLN nasal spray, Place 1 spray into both nostrils daily as needed for congestion., Disp: , Rfl:    Tavaborole (KERYDIN) 5 % SOLN, Apply 1 drop topically 1 day or 1 dose. Apply 1 drop to the toenail daily. (Patient not taking: Reported on 04/26/2018), Disp: 1 Bottle, Rfl: 3   Allergies  Allergen Reactions   Sulfonamide Derivatives Rash    Past Medical History:  Diagnosis Date   Asthma    GERD (gastroesophageal reflux disease)    CONTROLLED W/ PROTONIX   Hypertension    SUI (stress urinary incontinence, female)      Past Surgical History:  Procedure Laterality Date   CHOLECYSTECTOMY  2006   NASAL SINUS SURGERY  X2  (LAST ONE 2008)   PUBOVAGINAL SLING  01/05/2011   Procedure: Leonides Grills;  Surgeon: Garnett Farm, MD;  Location: [facility];  Service: Urology;  Laterality: N/A;  pubo- vaginal sling   TUBAL LIGATION  2004    Family History  Problem Relation Age of Onset   Allergies Mother    Asthma Mother    Hypertension Mother    Diabetes Mother     Social History   Tobacco Use   Smoking status: Never   Smokeless tobacco: Never  Substance Use Topics   Alcohol use: No   Drug use: No    ROS  Objective:   Vitals: BP (!) 150/97 (BP Location: Right Arm)   Pulse 72   Temp 97.7 F (36.5 C) (Oral)   Resp 18   LMP  (Within Months) Comment: 1  month  SpO2 100%   Physical Exam Constitutional:      General: She is not in acute distress.    Appearance: Normal appearance. She is well-developed. She is not ill-appearing.  HENT:     Head: Normocephalic and atraumatic.     Nose: Nose normal.     Mouth/Throat:     Mouth: Mucous membranes are moist.     Pharynx: Oropharynx is clear.  Eyes:     General: No scleral icterus.    Extraocular Movements: Extraocular movements intact.     Pupils: Pupils are equal, round, and reactive to light.  Cardiovascular:     Rate and Rhythm: Normal rate.  Pulmonary:     Effort: Pulmonary effort is  normal.  Musculoskeletal:     Left ankle: Swelling present. No deformity, ecchymosis or lacerations. Tenderness present over the lateral malleolus, ATF ligament and AITF ligament. No medial malleolus, CF ligament, posterior TF ligament, base of 5th metatarsal or proximal fibula tenderness. Decreased range of motion.     Left Achilles Tendon: No tenderness or defects. Thompson's test negative.     Left foot: Decreased range of motion. Normal capillary refill. Swelling, tenderness and bony tenderness present. No deformity, laceration or crepitus.       Legs:  Skin:    General: Skin is warm and dry.  Neurological:     General: No focal deficit present.     Mental Status: She is alert and oriented to person, place, and time.  Psychiatric:        Mood and Affect: Mood normal.        Behavior: Behavior normal.    DG Ankle Complete Left  Result Date: 11/29/2020 CLINICAL DATA:  Twisting injury with pain and swelling EXAM: LEFT ANKLE COMPLETE - 3+ VIEW COMPARISON:  None. FINDINGS: Bimalleolar soft tissue swelling. No acute fracture or dislocation. Base of fifth metatarsal and talar dome intact. Small calcaneal spur. Osseous irregularity involving the ventral navicular on the lateral view. IMPRESSION: Soft tissue swelling, without typical findings of acute osseous abnormality. Apparent irregularity within the ventral navicula is most likely within normal variation. This could be correlated with point tenderness. Electronically Signed   By: Rachael Calderon M.D.   On: 11/29/2020 20:11     Assessment and Plan :   PDMP not reviewed this encounter.  1. Sprain of left ankle, unspecified ligament, initial encounter   2. Abnormal x-ray of bone     Given that patient has focal tenderness over the abnormality on the x-ray, will manage for clavicular fracture.  Patient placed in a posterior and stirrup splint.  Ambulate with crutches as needed.  Recommended follow-up with Dr. Jena Gauss, ortho on call. Counseled  patient on potential for adverse effects with medications prescribed/recommended today, ER and return-to-clinic precautions discussed, patient verbalized understanding.    Wallis Bamberg, [place] 11/30/20 [phone]

## 2020-11-29 NOTE — Discharge Instructions (Signed)
Wear the splint at all times. Use crutches to move around. Follow up with Dr. Jena Gauss for a recheck on your possible fracture of the navicular bone. Please schedule naproxen twice daily with food for your severe pain.  If you still have pain despite taking naproxen regularly, this is breakthrough pain.  You can use hydrocodone, a narcotic pain medicine, once every 4-6 hours for this.  Once your pain is better controlled, switch back to just naproxen.

## 2020-12-25 DIAGNOSIS — J4550 Severe persistent asthma, uncomplicated: Secondary | ICD-10-CM | POA: Insufficient documentation

## 2020-12-25 DIAGNOSIS — G8929 Other chronic pain: Secondary | ICD-10-CM | POA: Insufficient documentation

## 2020-12-25 DIAGNOSIS — K5900 Constipation, unspecified: Secondary | ICD-10-CM | POA: Insufficient documentation

## 2020-12-25 DIAGNOSIS — R209 Unspecified disturbances of skin sensation: Secondary | ICD-10-CM | POA: Insufficient documentation

## 2020-12-25 DIAGNOSIS — N926 Irregular menstruation, unspecified: Secondary | ICD-10-CM | POA: Insufficient documentation

## 2020-12-25 DIAGNOSIS — G2581 Restless legs syndrome: Secondary | ICD-10-CM | POA: Insufficient documentation

## 2021-04-04 ENCOUNTER — Other Ambulatory Visit (HOSPITAL_COMMUNITY): Payer: Self-pay

## 2021-04-04 MED ORDER — DUPIXENT 300 MG/2ML ~~LOC~~ SOSY
PREFILLED_SYRINGE | SUBCUTANEOUS | 0 refills | Status: DC
  Filled 2021-04-04: qty 4, 28d supply, fill #0

## 2021-04-07 ENCOUNTER — Telehealth: Payer: Self-pay | Admitting: Pharmacist

## 2021-04-07 NOTE — Telephone Encounter (Signed)
Called patient to schedule an appointment for the [facility]. I was unable to reach the patient so I left a HIPAA-compliant message requesting that the patient return my call.   Luke Van Ausdall, PharmD, BCACP, CPP Clinical Pharmacist [facility] [phone]  

## 2021-04-08 ENCOUNTER — Other Ambulatory Visit (HOSPITAL_COMMUNITY): Payer: Self-pay

## 2021-04-09 ENCOUNTER — Other Ambulatory Visit: Payer: Self-pay | Admitting: Pharmacist

## 2021-04-09 ENCOUNTER — Ambulatory Visit: Payer: Managed Care, Other (non HMO) | Attending: Family Medicine | Admitting: Pharmacist

## 2021-04-09 ENCOUNTER — Other Ambulatory Visit: Payer: Self-pay

## 2021-04-09 ENCOUNTER — Other Ambulatory Visit (HOSPITAL_COMMUNITY): Payer: Self-pay

## 2021-04-09 DIAGNOSIS — Z79899 Other long term (current) drug therapy: Secondary | ICD-10-CM

## 2021-04-09 MED ORDER — DUPIXENT 300 MG/2ML ~~LOC~~ SOSY
PREFILLED_SYRINGE | SUBCUTANEOUS | 0 refills | Status: DC
  Filled 2021-04-09: qty 4, 28d supply, fill #0

## 2021-04-09 NOTE — Progress Notes (Signed)
° °  S: Patient presents for review of their specialty medication therapy.  Patient is currently taking Dupixent for asthma. Patient is managed by Dr. Laurance Flatten for this.   Adherence: confirms. Has been taking for several years  Efficacy: works well for her.   Dosing: 300 mg q14 days  Dose adjustments: Renal: no dose adjustments (has not been studied) Hepatic: no dose adjustments (has not been studied)  Drug-drug interactions: none  Monitoring: S/sx of infection: none S/sx of hypersensitivity: none S/sx of ocular effects: none S/sx of eosinophilia/vasculitis: none  O:     Lab Results  Component Value Date   WBC 5.1 07/15/2013   HGB 12.4 07/15/2013   HCT 36.4 07/15/2013   MCV 80.9 07/15/2013   PLT 243 07/15/2013      Chemistry      Component Value Date/Time   NA 136 (L) 07/15/2013 1635   K 3.1 (L) 07/15/2013 1635   CL 101 07/15/2013 1635   CO2 26 07/15/2013 1635   BUN 10 07/15/2013 1635   CREATININE 0.99 07/15/2013 1635      Component Value Date/Time   CALCIUM 9.3 07/15/2013 1635       A/P: 1. Medication review: Patient currently on [place] for asthma. Reviewed the medication with the patient, including the following: Dupixent is a monoclonal antibody used for the treatment of asthma or atopic dermatitis. Patient educated on purpose, proper use and potential adverse effects of Dupixent. Possible adverse effects include increased risk of infection, ocular effects, vasculitis/eosinophilia, and hypersensitivity reactions. Administer as a SubQ injection and rotate sites. Allow the medication to reach room temp prior to administration (45 mins for 300 mg syringe or 30 min for 200 mg syringe). Do not shake. Discard any unused portion. No recommendations for any changes.   Benard Halsted, PharmD, [place], [place] [phone]

## 2021-04-10 ENCOUNTER — Other Ambulatory Visit (HOSPITAL_COMMUNITY): Payer: Self-pay

## 2021-04-29 ENCOUNTER — Other Ambulatory Visit (HOSPITAL_COMMUNITY): Payer: Self-pay

## 2021-04-30 ENCOUNTER — Other Ambulatory Visit (HOSPITAL_COMMUNITY): Payer: Self-pay

## 2021-05-01 ENCOUNTER — Other Ambulatory Visit: Payer: Self-pay | Admitting: Pharmacist

## 2021-05-01 ENCOUNTER — Other Ambulatory Visit (HOSPITAL_COMMUNITY): Payer: Self-pay

## 2021-05-01 MED ORDER — DUPIXENT 300 MG/2ML ~~LOC~~ SOSY: PREFILLED_SYRINGE | SUBCUTANEOUS | 0 refills | Status: DC

## 2021-05-01 MED ORDER — DUPIXENT 300 MG/2ML ~~LOC~~ SOSY
PREFILLED_SYRINGE | SUBCUTANEOUS | 0 refills | Status: DC
  Filled 2021-05-01: qty 4, 28d supply, fill #0

## 2021-05-05 ENCOUNTER — Other Ambulatory Visit (HOSPITAL_COMMUNITY): Payer: Self-pay

## 2021-05-27 ENCOUNTER — Other Ambulatory Visit (HOSPITAL_COMMUNITY): Payer: Self-pay

## 2021-05-29 ENCOUNTER — Other Ambulatory Visit (HOSPITAL_COMMUNITY): Payer: Self-pay

## 2021-05-30 ENCOUNTER — Other Ambulatory Visit (HOSPITAL_COMMUNITY): Payer: Self-pay

## 2021-06-02 ENCOUNTER — Other Ambulatory Visit (HOSPITAL_COMMUNITY): Payer: Self-pay

## 2021-06-03 ENCOUNTER — Other Ambulatory Visit (HOSPITAL_COMMUNITY): Payer: Self-pay

## 2021-06-04 ENCOUNTER — Other Ambulatory Visit (HOSPITAL_COMMUNITY): Payer: Self-pay

## 2021-06-06 ENCOUNTER — Other Ambulatory Visit: Payer: Self-pay | Admitting: Pharmacist

## 2021-06-06 ENCOUNTER — Other Ambulatory Visit (HOSPITAL_COMMUNITY): Payer: Self-pay

## 2021-06-06 MED ORDER — DUPIXENT 300 MG/2ML ~~LOC~~ SOSY
PREFILLED_SYRINGE | SUBCUTANEOUS | 4 refills | Status: DC
  Filled 2021-06-06: qty 4, 28d supply, fill #0
  Filled 2021-06-27: qty 4, 28d supply, fill #1
  Filled 2021-07-23: qty 4, 28d supply, fill #2
  Filled 2021-08-21: qty 4, 28d supply, fill #3
  Filled 2021-09-18: qty 4, 28d supply, fill #4

## 2021-06-06 MED ORDER — DUPIXENT 300 MG/2ML ~~LOC~~ SOSY: PREFILLED_SYRINGE | SUBCUTANEOUS | 4 refills | Status: DC

## 2021-06-09 ENCOUNTER — Other Ambulatory Visit (HOSPITAL_COMMUNITY): Payer: Self-pay

## 2021-06-18 ENCOUNTER — Other Ambulatory Visit (HOSPITAL_COMMUNITY): Payer: Self-pay

## 2021-06-19 ENCOUNTER — Other Ambulatory Visit (HOSPITAL_COMMUNITY): Payer: Self-pay

## 2021-06-19 MED ORDER — DILTIAZEM HCL ER 120 MG PO CP24
ORAL_CAPSULE | ORAL | 2 refills | Status: DC
  Filled 2021-06-19: qty 30, 30d supply, fill #0
  Filled 2021-08-08: qty 30, 30d supply, fill #1
  Filled 2021-09-04: qty 25, 25d supply, fill #2
  Filled 2021-09-04: qty 5, 5d supply, fill #2

## 2021-06-19 MED ORDER — MONTELUKAST SODIUM 10 MG PO TABS
ORAL_TABLET | ORAL | 5 refills | Status: DC
  Filled 2021-06-19: qty 60, 30d supply, fill #0
  Filled 2021-09-05: qty 60, 30d supply, fill #1
  Filled 2021-11-27: qty 60, 30d supply, fill #2

## 2021-06-19 MED ORDER — HYDROCHLOROTHIAZIDE 25 MG PO TABS
ORAL_TABLET | ORAL | 0 refills | Status: DC
  Filled 2021-07-28: qty 30, 30d supply, fill #0

## 2021-06-26 ENCOUNTER — Other Ambulatory Visit (HOSPITAL_COMMUNITY): Payer: Self-pay

## 2021-06-27 ENCOUNTER — Other Ambulatory Visit (HOSPITAL_COMMUNITY): Payer: Self-pay

## 2021-06-30 ENCOUNTER — Other Ambulatory Visit (HOSPITAL_COMMUNITY): Payer: Self-pay

## 2021-07-15 ENCOUNTER — Other Ambulatory Visit (HOSPITAL_COMMUNITY): Payer: Self-pay

## 2021-07-15 MED ORDER — ROSUVASTATIN CALCIUM 10 MG PO TABS
ORAL_TABLET | ORAL | 1 refills | Status: AC
  Filled 2021-07-15: qty 90, 90d supply, fill #0

## 2021-07-23 ENCOUNTER — Other Ambulatory Visit (HOSPITAL_COMMUNITY): Payer: Self-pay

## 2021-07-28 ENCOUNTER — Other Ambulatory Visit (HOSPITAL_COMMUNITY): Payer: Self-pay

## 2021-08-08 ENCOUNTER — Other Ambulatory Visit (HOSPITAL_COMMUNITY): Payer: Self-pay

## 2021-08-09 ENCOUNTER — Other Ambulatory Visit (HOSPITAL_COMMUNITY): Payer: Self-pay

## 2021-08-21 ENCOUNTER — Other Ambulatory Visit (HOSPITAL_COMMUNITY): Payer: Self-pay

## 2021-08-25 ENCOUNTER — Other Ambulatory Visit (HOSPITAL_COMMUNITY): Payer: Self-pay

## 2021-08-26 ENCOUNTER — Other Ambulatory Visit (HOSPITAL_COMMUNITY): Payer: Self-pay

## 2021-08-26 MED ORDER — PHENTERMINE HCL 15 MG PO CAPS
ORAL_CAPSULE | ORAL | 0 refills | Status: DC
  Filled 2021-08-26: qty 30, 30d supply, fill #0

## 2021-09-04 ENCOUNTER — Other Ambulatory Visit (HOSPITAL_COMMUNITY): Payer: Self-pay

## 2021-09-04 MED ORDER — HYDROCHLOROTHIAZIDE 25 MG PO TABS
ORAL_TABLET | ORAL | 3 refills | Status: DC
  Filled 2021-09-04: qty 90, 90d supply, fill #0
  Filled 2021-11-27: qty 90, 90d supply, fill #1
  Filled 2022-05-02 (×2): qty 90, 90d supply, fill #2
  Filled 2022-08-17: qty 90, 90d supply, fill #3

## 2021-09-05 ENCOUNTER — Other Ambulatory Visit (HOSPITAL_COMMUNITY): Payer: Self-pay

## 2021-09-06 ENCOUNTER — Other Ambulatory Visit (HOSPITAL_COMMUNITY): Payer: Self-pay

## 2021-09-18 ENCOUNTER — Other Ambulatory Visit (HOSPITAL_COMMUNITY): Payer: Self-pay

## 2021-09-19 ENCOUNTER — Other Ambulatory Visit (HOSPITAL_COMMUNITY): Payer: Self-pay

## 2021-09-23 ENCOUNTER — Other Ambulatory Visit (HOSPITAL_COMMUNITY): Payer: Self-pay

## 2021-09-23 MED ORDER — PHENTERMINE HCL 15 MG PO CAPS
ORAL_CAPSULE | ORAL | 0 refills | Status: AC
  Filled 2021-09-23: qty 14, 14d supply, fill #0

## 2021-10-03 ENCOUNTER — Other Ambulatory Visit (HOSPITAL_COMMUNITY): Payer: Self-pay

## 2021-10-03 MED ORDER — PHENTERMINE HCL 37.5 MG PO TABS
ORAL_TABLET | ORAL | 0 refills | Status: DC
  Filled 2021-10-03: qty 30, 30d supply, fill #0

## 2021-10-13 ENCOUNTER — Other Ambulatory Visit (HOSPITAL_COMMUNITY): Payer: Self-pay

## 2021-10-14 ENCOUNTER — Other Ambulatory Visit (HOSPITAL_COMMUNITY): Payer: Self-pay

## 2021-10-14 MED ORDER — DUPIXENT 300 MG/2ML ~~LOC~~ SOSY
PREFILLED_SYRINGE | SUBCUTANEOUS | 11 refills | Status: DC
  Filled 2021-10-14: qty 4, 28d supply, fill #0

## 2021-10-15 ENCOUNTER — Other Ambulatory Visit (HOSPITAL_COMMUNITY): Payer: Self-pay

## 2021-10-15 ENCOUNTER — Other Ambulatory Visit: Payer: Self-pay | Admitting: Pharmacist

## 2021-10-15 MED ORDER — DUPIXENT 300 MG/2ML ~~LOC~~ SOSY
PREFILLED_SYRINGE | SUBCUTANEOUS | 11 refills | Status: DC
Start: 2021-10-15 — End: 2022-09-23
  Filled 2021-10-15: qty 4, 28d supply, fill #0
  Filled 2021-11-12: qty 4, 28d supply, fill #1
  Filled 2021-12-09 (×2): qty 4, 28d supply, fill #2
  Filled 2022-01-01: qty 4, 28d supply, fill #3
  Filled 2022-02-03: qty 4, 28d supply, fill #4
  Filled 2022-03-04: qty 4, 28d supply, fill #5
  Filled 2022-04-02: qty 4, 28d supply, fill #6
  Filled 2022-04-30: qty 4, 28d supply, fill #7
  Filled 2022-06-01: qty 4, 28d supply, fill #8
  Filled 2022-06-23: qty 4, 28d supply, fill #9
  Filled 2022-07-22: qty 4, 28d supply, fill #10
  Filled 2022-08-18: qty 4, 28d supply, fill #11

## 2021-10-22 ENCOUNTER — Other Ambulatory Visit (HOSPITAL_COMMUNITY): Payer: Self-pay

## 2021-10-24 ENCOUNTER — Other Ambulatory Visit (HOSPITAL_COMMUNITY): Payer: Self-pay

## 2021-10-27 ENCOUNTER — Other Ambulatory Visit (HOSPITAL_COMMUNITY): Payer: Self-pay

## 2021-10-27 MED ORDER — DILTIAZEM HCL ER 120 MG PO CP24
120.0000 mg | ORAL_CAPSULE | Freq: Every morning | ORAL | 2 refills | Status: DC
  Filled 2021-10-27: qty 30, 30d supply, fill #0
  Filled 2021-11-27: qty 30, 30d supply, fill #1
  Filled 2022-01-15: qty 30, 30d supply, fill #2

## 2021-10-28 ENCOUNTER — Other Ambulatory Visit (HOSPITAL_COMMUNITY): Payer: Self-pay

## 2021-11-11 ENCOUNTER — Other Ambulatory Visit (HOSPITAL_COMMUNITY): Payer: Self-pay

## 2021-11-11 MED ORDER — PHENTERMINE HCL 37.5 MG PO TABS
37.5000 mg | ORAL_TABLET | Freq: Every morning | ORAL | 0 refills | Status: DC
  Filled 2021-11-11: qty 30, 30d supply, fill #0

## 2021-11-12 ENCOUNTER — Other Ambulatory Visit (HOSPITAL_COMMUNITY): Payer: Self-pay

## 2021-11-18 ENCOUNTER — Other Ambulatory Visit (HOSPITAL_COMMUNITY): Payer: Self-pay

## 2021-11-27 ENCOUNTER — Other Ambulatory Visit (HOSPITAL_COMMUNITY): Payer: Self-pay

## 2021-11-28 ENCOUNTER — Other Ambulatory Visit (HOSPITAL_COMMUNITY): Payer: Self-pay

## 2021-12-08 ENCOUNTER — Ambulatory Visit: Payer: Self-pay | Admitting: Podiatrist

## 2021-12-09 ENCOUNTER — Other Ambulatory Visit (HOSPITAL_COMMUNITY): Payer: Self-pay

## 2021-12-10 ENCOUNTER — Ambulatory Visit: Payer: Self-pay | Admitting: Podiatry

## 2021-12-15 ENCOUNTER — Other Ambulatory Visit (HOSPITAL_COMMUNITY): Payer: Self-pay

## 2021-12-16 ENCOUNTER — Other Ambulatory Visit (HOSPITAL_COMMUNITY): Payer: Self-pay

## 2021-12-22 ENCOUNTER — Other Ambulatory Visit (HOSPITAL_COMMUNITY): Payer: Self-pay

## 2021-12-22 MED ORDER — TOPIRAMATE 25 MG PO TABS
25.0000 mg | ORAL_TABLET | Freq: Every day | ORAL | 0 refills | Status: AC
Start: 2021-12-22 — End: ?
  Filled 2021-12-22: qty 30, 30d supply, fill #0

## 2021-12-22 MED ORDER — PHENTERMINE HCL 37.5 MG PO TABS
37.5000 mg | ORAL_TABLET | Freq: Every day | ORAL | 0 refills | Status: DC
  Filled 2021-12-22: qty 28, 28d supply, fill #0

## 2021-12-23 ENCOUNTER — Other Ambulatory Visit (HOSPITAL_COMMUNITY): Payer: Self-pay

## 2022-01-01 ENCOUNTER — Other Ambulatory Visit (HOSPITAL_COMMUNITY): Payer: Self-pay

## 2022-01-10 ENCOUNTER — Other Ambulatory Visit (HOSPITAL_COMMUNITY): Payer: Self-pay

## 2022-01-14 IMAGING — DX DG ANKLE COMPLETE 3+V*L*
3 series · 3 of 3 positions shown · non-contrast
Comparison: None.

CLINICAL DATA: Twisting injury with pain and swelling

EXAM:
LEFT ANKLE COMPLETE - 3+ VIEW

[ankle ap]
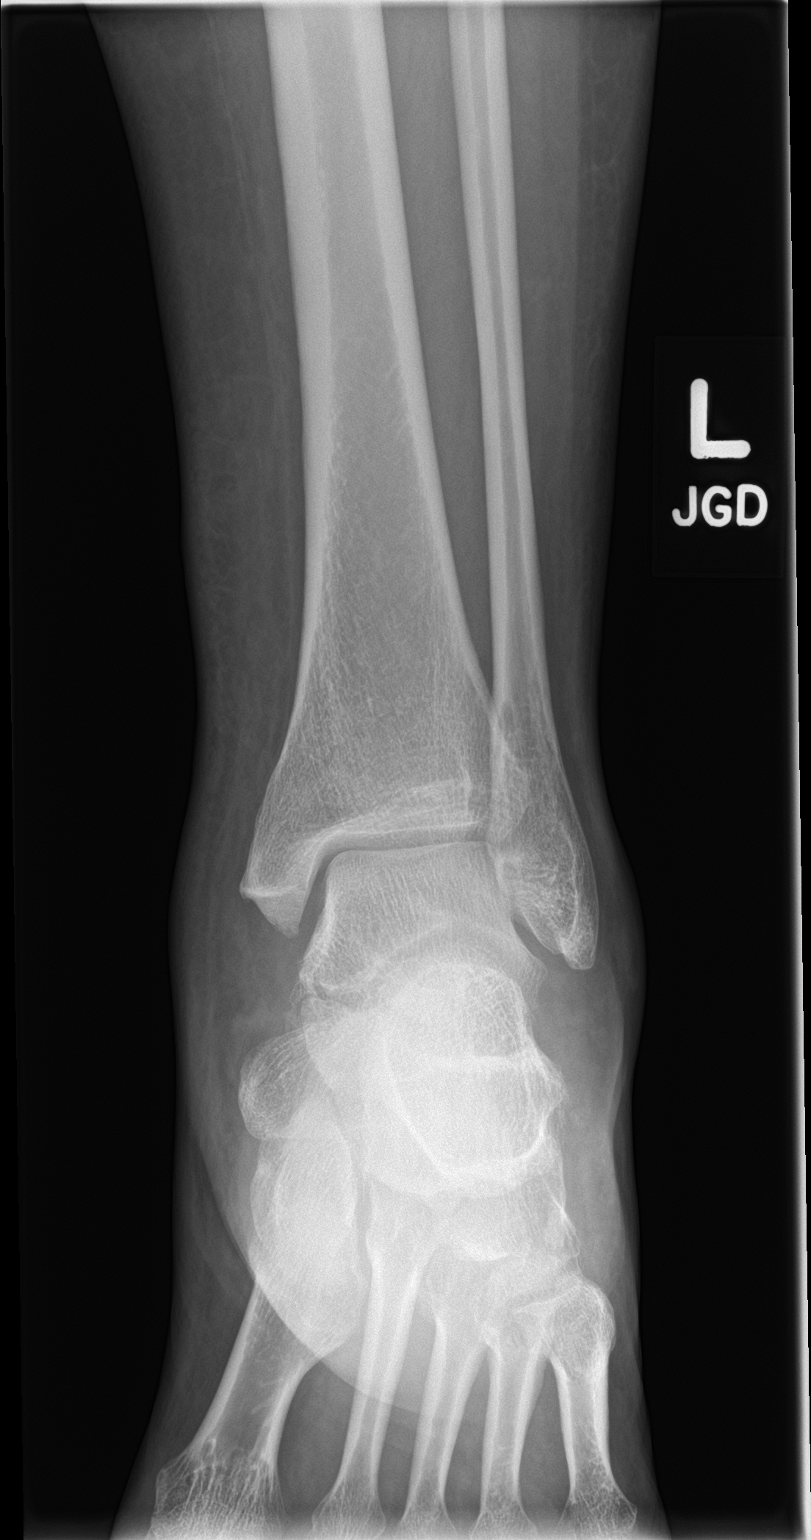

[ankle obl]
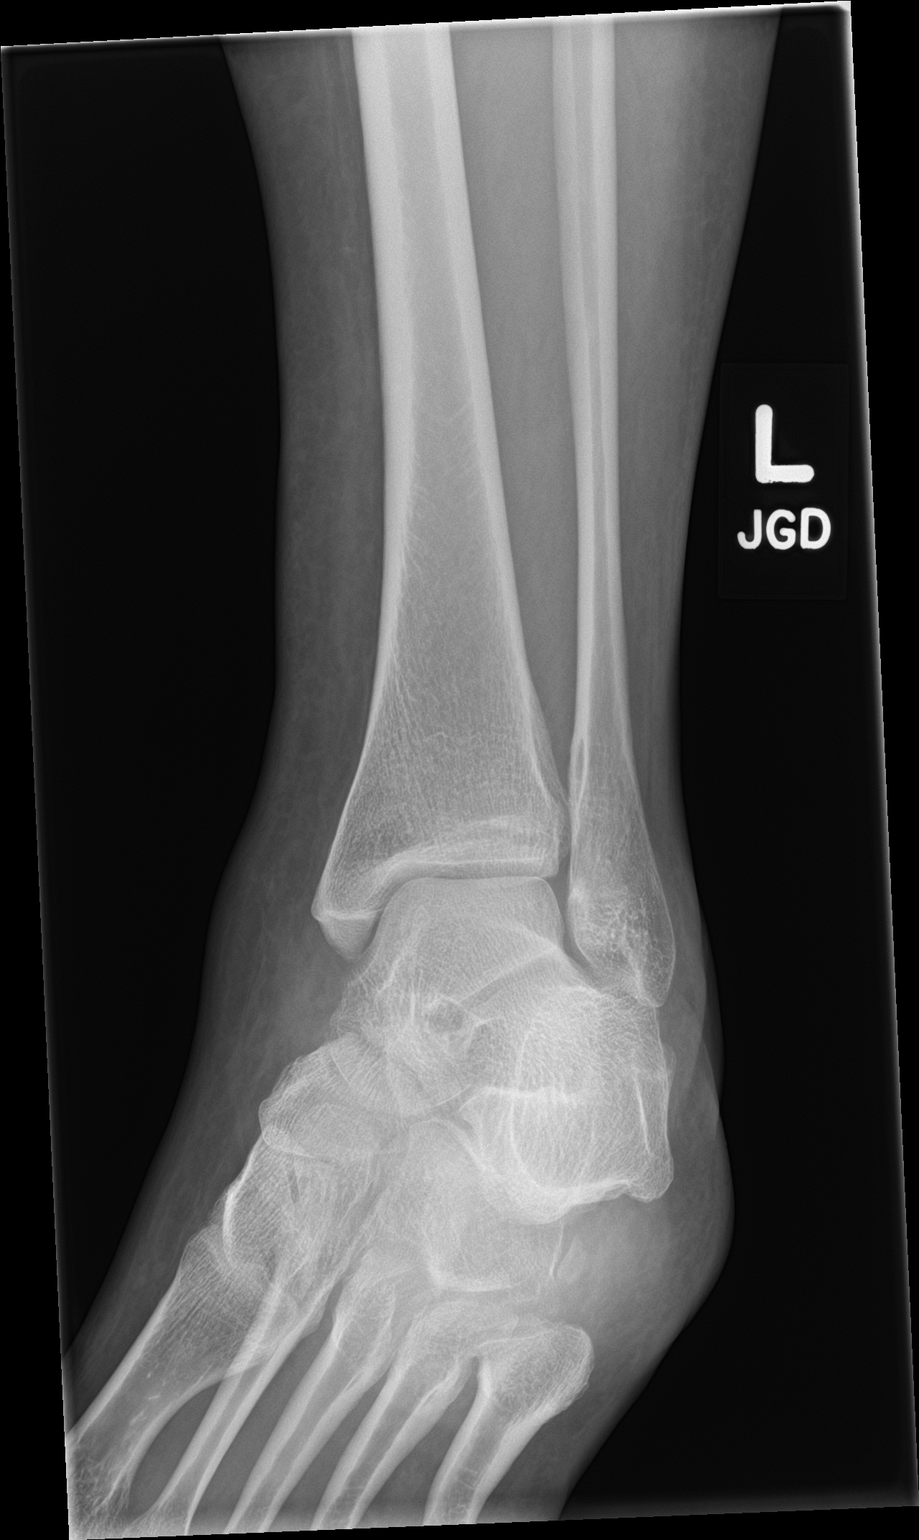

[ankle lat]
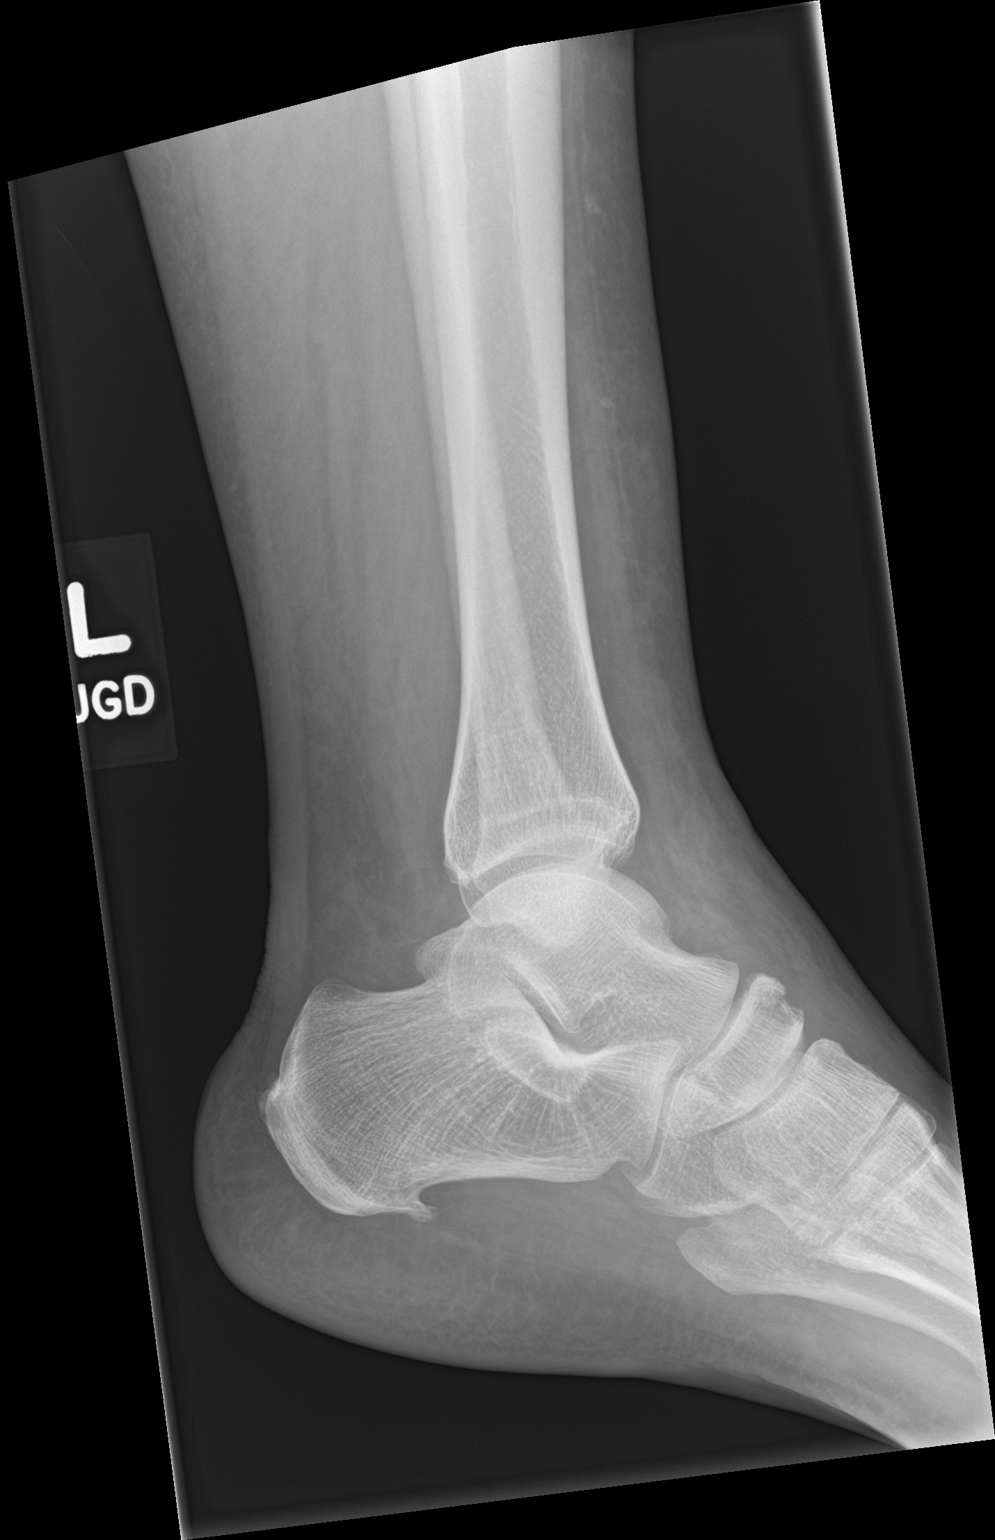

[3 of 3 positions shown; findings below may reference images not displayed]

FINDINGS: Bimalleolar soft tissue swelling. No acute fracture or dislocation.
Base of fifth metatarsal and talar dome intact. Small calcaneal
spur. Osseous irregularity involving the ventral navicular on the
lateral view.
IMPRESSION: Soft tissue swelling, without typical findings of acute osseous
abnormality.

Apparent irregularity within the ventral navicula is most likely
within normal variation. This could be correlated with point
tenderness.

## 2022-01-15 ENCOUNTER — Other Ambulatory Visit (HOSPITAL_COMMUNITY): Payer: Self-pay

## 2022-01-23 ENCOUNTER — Other Ambulatory Visit (HOSPITAL_COMMUNITY): Payer: Self-pay

## 2022-01-23 MED ORDER — PHENTERMINE HCL 37.5 MG PO TABS
ORAL_TABLET | ORAL | 0 refills | Status: DC
  Filled 2022-01-23: qty 28, 28d supply, fill #0

## 2022-02-03 ENCOUNTER — Other Ambulatory Visit: Payer: Self-pay

## 2022-02-03 ENCOUNTER — Other Ambulatory Visit (HOSPITAL_COMMUNITY): Payer: Self-pay

## 2022-02-04 ENCOUNTER — Other Ambulatory Visit (HOSPITAL_COMMUNITY): Payer: Self-pay

## 2022-02-17 DIAGNOSIS — J339 Nasal polyp, unspecified: Secondary | ICD-10-CM | POA: Diagnosis not present

## 2022-02-17 DIAGNOSIS — J324 Chronic pansinusitis: Secondary | ICD-10-CM | POA: Diagnosis not present

## 2022-02-17 DIAGNOSIS — J4550 Severe persistent asthma, uncomplicated: Secondary | ICD-10-CM | POA: Diagnosis not present

## 2022-02-17 DIAGNOSIS — J8283 Eosinophilic asthma: Secondary | ICD-10-CM | POA: Diagnosis not present

## 2022-02-17 DIAGNOSIS — Z886 Allergy status to analgesic agent status: Secondary | ICD-10-CM | POA: Diagnosis not present

## 2022-02-17 DIAGNOSIS — J45909 Unspecified asthma, uncomplicated: Secondary | ICD-10-CM | POA: Diagnosis not present

## 2022-02-20 ENCOUNTER — Other Ambulatory Visit (HOSPITAL_COMMUNITY): Payer: Self-pay

## 2022-02-23 ENCOUNTER — Other Ambulatory Visit (HOSPITAL_COMMUNITY): Payer: Self-pay

## 2022-02-23 MED ORDER — MONTELUKAST SODIUM 10 MG PO TABS
10.0000 mg | ORAL_TABLET | Freq: Two times a day (BID) | ORAL | 11 refills | Status: DC
  Filled 2022-02-23 – 2022-02-25 (×2): qty 60, 30d supply, fill #0
  Filled 2022-05-27: qty 60, 30d supply, fill #1
  Filled 2022-05-28: qty 60, 30d supply, fill #0
  Filled 2022-08-17: qty 60, 30d supply, fill #1
  Filled 2022-11-11: qty 60, 30d supply, fill #2
  Filled 2022-12-26: qty 60, 30d supply, fill #3
  Filled 2023-01-21: qty 60, 30d supply, fill #4
  Filled 2023-02-20: qty 60, 30d supply, fill #5

## 2022-02-24 ENCOUNTER — Other Ambulatory Visit (HOSPITAL_COMMUNITY): Payer: Self-pay

## 2022-02-24 MED ORDER — DILTIAZEM HCL ER 120 MG PO CP24
120.0000 mg | ORAL_CAPSULE | Freq: Every morning | ORAL | 2 refills | Status: DC
Start: 2022-02-24 — End: 2022-07-09
  Filled 2022-02-24: qty 30, 30d supply, fill #0
  Filled 2022-04-06: qty 30, 30d supply, fill #1
  Filled 2022-05-02: qty 30, 30d supply, fill #2

## 2022-02-25 ENCOUNTER — Other Ambulatory Visit (HOSPITAL_COMMUNITY): Payer: Self-pay

## 2022-03-04 ENCOUNTER — Other Ambulatory Visit (HOSPITAL_COMMUNITY): Payer: Self-pay

## 2022-03-05 DIAGNOSIS — R7303 Prediabetes: Secondary | ICD-10-CM | POA: Diagnosis not present

## 2022-03-05 DIAGNOSIS — I10 Essential (primary) hypertension: Secondary | ICD-10-CM | POA: Diagnosis not present

## 2022-03-05 DIAGNOSIS — R202 Paresthesia of skin: Secondary | ICD-10-CM | POA: Diagnosis not present

## 2022-03-05 DIAGNOSIS — Z6837 Body mass index (BMI) 37.0-37.9, adult: Secondary | ICD-10-CM | POA: Diagnosis not present

## 2022-03-06 ENCOUNTER — Other Ambulatory Visit (HOSPITAL_COMMUNITY): Payer: Self-pay

## 2022-03-06 MED ORDER — PHENTERMINE HCL 37.5 MG PO TABS
37.5000 mg | ORAL_TABLET | Freq: Every day | ORAL | 0 refills | Status: DC
  Filled 2022-03-06: qty 28, 28d supply, fill #0

## 2022-03-09 ENCOUNTER — Other Ambulatory Visit (HOSPITAL_COMMUNITY): Payer: Self-pay

## 2022-03-12 ENCOUNTER — Other Ambulatory Visit (HOSPITAL_COMMUNITY): Payer: Self-pay

## 2022-04-02 ENCOUNTER — Other Ambulatory Visit (HOSPITAL_COMMUNITY): Payer: Self-pay

## 2022-04-02 ENCOUNTER — Telehealth: Payer: Self-pay | Admitting: Pharmacist

## 2022-04-02 NOTE — Telephone Encounter (Signed)
Called patient to schedule an appointment for the [facility]. I was unable to reach the patient so I left a HIPAA-compliant message requesting that the patient return my call.   Luke Van Ausdall, PharmD, BCACP, CPP Clinical Pharmacist [facility] [phone]  

## 2022-04-06 ENCOUNTER — Other Ambulatory Visit (HOSPITAL_COMMUNITY): Payer: Self-pay

## 2022-04-06 ENCOUNTER — Other Ambulatory Visit: Payer: Self-pay

## 2022-04-06 DIAGNOSIS — Z1231 Encounter for screening mammogram for malignant neoplasm of breast: Secondary | ICD-10-CM | POA: Diagnosis not present

## 2022-04-15 ENCOUNTER — Other Ambulatory Visit (HOSPITAL_COMMUNITY): Payer: Self-pay

## 2022-04-15 DIAGNOSIS — Z6836 Body mass index (BMI) 36.0-36.9, adult: Secondary | ICD-10-CM | POA: Diagnosis not present

## 2022-04-15 DIAGNOSIS — I10 Essential (primary) hypertension: Secondary | ICD-10-CM | POA: Diagnosis not present

## 2022-04-15 MED ORDER — PHENTERMINE HCL 37.5 MG PO TABS
37.5000 mg | ORAL_TABLET | Freq: Every day | ORAL | 0 refills | Status: DC
  Filled 2022-04-15: qty 28, 28d supply, fill #0

## 2022-04-16 ENCOUNTER — Other Ambulatory Visit (HOSPITAL_COMMUNITY): Payer: Self-pay

## 2022-04-30 ENCOUNTER — Other Ambulatory Visit (HOSPITAL_COMMUNITY): Payer: Self-pay

## 2022-05-02 ENCOUNTER — Other Ambulatory Visit (HOSPITAL_COMMUNITY): Payer: Self-pay

## 2022-05-04 ENCOUNTER — Other Ambulatory Visit: Payer: Self-pay

## 2022-05-09 ENCOUNTER — Other Ambulatory Visit (HOSPITAL_COMMUNITY): Payer: Self-pay

## 2022-05-25 ENCOUNTER — Other Ambulatory Visit (HOSPITAL_COMMUNITY): Payer: Self-pay

## 2022-05-25 DIAGNOSIS — M542 Cervicalgia: Secondary | ICD-10-CM | POA: Diagnosis not present

## 2022-05-25 MED ORDER — GABAPENTIN 100 MG PO CAPS
100.0000 mg | ORAL_CAPSULE | Freq: Three times a day (TID) | ORAL | 0 refills | Status: AC
  Filled 2022-05-25: qty 90, 30d supply, fill #0

## 2022-05-27 ENCOUNTER — Other Ambulatory Visit (HOSPITAL_COMMUNITY): Payer: Self-pay

## 2022-05-27 DIAGNOSIS — I10 Essential (primary) hypertension: Secondary | ICD-10-CM | POA: Diagnosis not present

## 2022-05-27 DIAGNOSIS — R7303 Prediabetes: Secondary | ICD-10-CM | POA: Diagnosis not present

## 2022-05-27 DIAGNOSIS — Z6836 Body mass index (BMI) 36.0-36.9, adult: Secondary | ICD-10-CM | POA: Diagnosis not present

## 2022-05-27 MED ORDER — PHENTERMINE HCL 37.5 MG PO TABS
37.5000 mg | ORAL_TABLET | Freq: Every day | ORAL | 1 refills | Status: AC
  Filled 2022-05-27: qty 30, 30d supply, fill #0

## 2022-05-28 ENCOUNTER — Other Ambulatory Visit (HOSPITAL_COMMUNITY): Payer: Self-pay

## 2022-06-01 ENCOUNTER — Other Ambulatory Visit: Payer: Self-pay

## 2022-06-03 ENCOUNTER — Other Ambulatory Visit: Payer: Self-pay

## 2022-06-13 ENCOUNTER — Other Ambulatory Visit (HOSPITAL_COMMUNITY): Payer: Self-pay

## 2022-06-19 ENCOUNTER — Telehealth: Payer: Self-pay | Admitting: Pharmacist

## 2022-06-19 NOTE — Telephone Encounter (Signed)
Called patient to schedule an appointment for the [facility]. I was unable to reach the patient so I left a HIPAA-compliant message requesting that the patient return my call.   Luke Van Ausdall, PharmD, BCACP, CPP Clinical Pharmacist [facility] [phone]  

## 2022-06-23 ENCOUNTER — Other Ambulatory Visit (HOSPITAL_COMMUNITY): Payer: Self-pay

## 2022-06-25 ENCOUNTER — Other Ambulatory Visit: Payer: Self-pay

## 2022-06-30 ENCOUNTER — Other Ambulatory Visit (HOSPITAL_COMMUNITY): Payer: Self-pay

## 2022-07-08 DIAGNOSIS — Z6836 Body mass index (BMI) 36.0-36.9, adult: Secondary | ICD-10-CM | POA: Diagnosis not present

## 2022-07-08 DIAGNOSIS — J4550 Severe persistent asthma, uncomplicated: Secondary | ICD-10-CM | POA: Diagnosis not present

## 2022-07-08 DIAGNOSIS — Z0000 Encounter for general adult medical examination without abnormal findings: Secondary | ICD-10-CM | POA: Diagnosis not present

## 2022-07-08 DIAGNOSIS — I10 Essential (primary) hypertension: Secondary | ICD-10-CM | POA: Diagnosis not present

## 2022-07-08 DIAGNOSIS — E7800 Pure hypercholesterolemia, unspecified: Secondary | ICD-10-CM | POA: Diagnosis not present

## 2022-07-08 DIAGNOSIS — R7303 Prediabetes: Secondary | ICD-10-CM | POA: Diagnosis not present

## 2022-07-09 ENCOUNTER — Other Ambulatory Visit (HOSPITAL_COMMUNITY): Payer: Self-pay

## 2022-07-09 MED ORDER — DILTIAZEM HCL ER 120 MG PO CP24
120.0000 mg | ORAL_CAPSULE | Freq: Every morning | ORAL | 1 refills | Status: DC
  Filled 2022-07-09: qty 90, 90d supply, fill #0
  Filled 2022-10-14: qty 90, 90d supply, fill #1

## 2022-07-09 MED ORDER — PHENTERMINE HCL 37.5 MG PO TABS
37.5000 mg | ORAL_TABLET | Freq: Every day | ORAL | 0 refills | Status: DC
  Filled 2022-07-09: qty 30, 30d supply, fill #0

## 2022-07-10 ENCOUNTER — Other Ambulatory Visit (HOSPITAL_COMMUNITY): Payer: Self-pay

## 2022-07-10 MED ORDER — POTASSIUM CHLORIDE CRYS ER 20 MEQ PO TBCR
20.0000 meq | EXTENDED_RELEASE_TABLET | Freq: Every day | ORAL | 3 refills | Status: AC
  Filled 2022-07-10: qty 90, 90d supply, fill #0
  Filled 2022-12-26: qty 90, 90d supply, fill #1
  Filled 2023-03-22: qty 90, 90d supply, fill #2
  Filled 2023-06-21: qty 90, 90d supply, fill #3

## 2022-07-13 DIAGNOSIS — R202 Paresthesia of skin: Secondary | ICD-10-CM | POA: Diagnosis not present

## 2022-07-13 DIAGNOSIS — R42 Dizziness and giddiness: Secondary | ICD-10-CM | POA: Diagnosis not present

## 2022-07-14 ENCOUNTER — Emergency Department (HOSPITAL_BASED_OUTPATIENT_CLINIC_OR_DEPARTMENT_OTHER)
Admission: EM | Admit: 2022-07-14 | Discharge: 2022-07-14 | Disposition: A | Discharge status: Home / Self Care | Payer: Commercial Managed Care - PPO | Attending: Emergency Medicine | Admitting: Emergency Medicine

## 2022-07-14 ENCOUNTER — Encounter (HOSPITAL_BASED_OUTPATIENT_CLINIC_OR_DEPARTMENT_OTHER): Payer: Self-pay | Admitting: Emergency Medicine

## 2022-07-14 ENCOUNTER — Emergency Department (HOSPITAL_COMMUNITY): Payer: Commercial Managed Care - PPO

## 2022-07-14 ENCOUNTER — Emergency Department (HOSPITAL_BASED_OUTPATIENT_CLINIC_OR_DEPARTMENT_OTHER): Payer: Commercial Managed Care - PPO

## 2022-07-14 ENCOUNTER — Other Ambulatory Visit: Payer: Self-pay

## 2022-07-14 DIAGNOSIS — E876 Hypokalemia: Secondary | ICD-10-CM | POA: Insufficient documentation

## 2022-07-14 DIAGNOSIS — R200 Anesthesia of skin: Secondary | ICD-10-CM | POA: Insufficient documentation

## 2022-07-14 DIAGNOSIS — R55 Syncope and collapse: Secondary | ICD-10-CM | POA: Insufficient documentation

## 2022-07-14 DIAGNOSIS — M47812 Spondylosis without myelopathy or radiculopathy, cervical region: Secondary | ICD-10-CM | POA: Diagnosis not present

## 2022-07-14 DIAGNOSIS — R29818 Other symptoms and signs involving the nervous system: Secondary | ICD-10-CM | POA: Diagnosis not present

## 2022-07-14 DIAGNOSIS — R202 Paresthesia of skin: Secondary | ICD-10-CM | POA: Diagnosis not present

## 2022-07-14 LAB — URINALYSIS, ROUTINE W REFLEX MICROSCOPIC
Bilirubin Urine: NEGATIVE
Glucose, UA: NEGATIVE mg/dL
Ketones, ur: NEGATIVE mg/dL
Leukocytes,Ua: NEGATIVE
Nitrite: NEGATIVE
Protein, ur: NEGATIVE mg/dL
Specific Gravity, Urine: 1.015 (ref 1.005–1.030)
pH: 6 (ref 5.0–8.0)

## 2022-07-14 LAB — CBC
HCT: 38.1 % (ref 36.0–46.0)
Hemoglobin: 12.8 g/dL (ref 12.0–15.0)
MCH: 27.5 pg (ref 26.0–34.0)
MCHC: 33.6 g/dL (ref 30.0–36.0)
MCV: 81.9 fL (ref 80.0–100.0)
Platelets: 240 10*3/uL (ref 150–400)
RBC: 4.65 MIL/uL (ref 3.87–5.11)
RDW: 13.2 % (ref 11.5–15.5)
WBC: 4.7 10*3/uL (ref 4.0–10.5)
nRBC: 0 % (ref 0.0–0.2)

## 2022-07-14 LAB — URINALYSIS, MICROSCOPIC (REFLEX)
Bacteria, UA: NONE SEEN
RBC / HPF: NONE SEEN RBC/hpf (ref 0–5)
WBC, UA: NONE SEEN WBC/hpf (ref 0–5)

## 2022-07-14 LAB — RAPID URINE DRUG SCREEN, HOSP PERFORMED
Amphetamines: NOT DETECTED
Barbiturates: NOT DETECTED
Benzodiazepines: NOT DETECTED
Cocaine: NOT DETECTED
Opiates: NOT DETECTED
Tetrahydrocannabinol: NOT DETECTED

## 2022-07-14 LAB — APTT: aPTT: 23 seconds — ABNORMAL LOW (ref 24–36)

## 2022-07-14 LAB — DIFFERENTIAL
Abs Immature Granulocytes: 0.01 10*3/uL (ref 0.00–0.07)
Basophils Absolute: 0 10*3/uL (ref 0.0–0.1)
Basophils Relative: 0 %
Eosinophils Absolute: 0.3 10*3/uL (ref 0.0–0.5)
Eosinophils Relative: 5 %
Immature Granulocytes: 0 %
Lymphocytes Relative: 39 %
Lymphs Abs: 2 10*3/uL (ref 0.7–4.0)
Monocytes Absolute: 0.5 10*3/uL (ref 0.1–1.0)
Monocytes Relative: 9 %
Neutro Abs: 2.4 10*3/uL (ref 1.7–7.7)
Neutrophils Relative %: 47 %

## 2022-07-14 LAB — PROTIME-INR
INR: 1 (ref 0.8–1.2)
Prothrombin Time: 13.2 seconds (ref 11.4–15.2)

## 2022-07-14 LAB — BASIC METABOLIC PANEL
Anion gap: 9 (ref 5–15)
BUN: 14 mg/dL (ref 6–20)
CO2: 27 mmol/L (ref 22–32)
Calcium: 8.7 mg/dL — ABNORMAL LOW (ref 8.9–10.3)
Chloride: 100 mmol/L (ref 98–111)
Creatinine, Ser: 1.02 mg/dL — ABNORMAL HIGH (ref 0.44–1.00)
GFR, Estimated: >60 mL/min (ref >60)
Glucose, Bld: 112 mg/dL — ABNORMAL HIGH (ref 70–99)
Potassium: 2.9 mmol/L — ABNORMAL LOW (ref 3.5–5.1)
Sodium: 136 mmol/L (ref 135–145)

## 2022-07-14 MED ORDER — LORAZEPAM 2 MG/ML IJ SOLN: 1.0000 mg | Freq: Once | INTRAMUSCULAR | Status: DC | PRN

## 2022-07-14 MED ORDER — POTASSIUM CHLORIDE CRYS ER 20 MEQ PO TBCR
40.0000 meq | EXTENDED_RELEASE_TABLET | Freq: Once | ORAL | Status: AC
  Administered 2022-07-14: 40 meq via ORAL
  Filled 2022-07-14: qty 2

## 2022-07-14 MED ORDER — LORAZEPAM 1 MG PO TABS: 1.0000 mg | ORAL_TABLET | Freq: Once | ORAL | Status: DC | PRN

## 2022-07-14 MED ORDER — GADOBUTROL 1 MMOL/ML IV SOLN
10.0000 mL | Freq: Once | INTRAVENOUS | Status: AC | PRN
  Administered 2022-07-14: 10 mL via INTRAVENOUS

## 2022-07-14 MED ORDER — POTASSIUM CHLORIDE 10 MEQ/100ML IV SOLN
10.0000 meq | INTRAVENOUS | Status: AC
  Filled 2022-07-14: qty 100

## 2022-07-14 NOTE — ED Provider Notes (Signed)
  Physical Exam  BP (!) 143/104 (BP Location: Right Arm)   Pulse 73   Temp 97.8 F (36.6 C) (Oral)   Resp 15   Ht 5' 6.5" (1.689 m)   Wt 106.6 kg   LMP 11/17/2021   SpO2 100%   BMI 37.36 kg/m   Physical Exam Vitals and nursing note reviewed.  Constitutional:      Appearance: Normal appearance.  HENT:     Head: Normocephalic and atraumatic.     Mouth/Throat:     Mouth: Mucous membranes are moist.  Eyes:     Conjunctiva/sclera: Conjunctivae normal.  Cardiovascular:     Rate and Rhythm: Normal rate.  Pulmonary:     Effort: Pulmonary effort is normal. No respiratory distress.  Abdominal:     General: Abdomen is flat.  Musculoskeletal:        General: No deformity.  Skin:    General: Skin is warm and dry.     Capillary Refill: Capillary refill takes less than 2 seconds.  Neurological:     General: No focal deficit present.     Mental Status: She is alert. Mental status is at baseline.     Comments: Cranial nerves II through XII intact, strength 5 out of 5 in the bilateral upper extremities without pronator drift or sensory deficit   Psychiatric:        Mood and Affect: Mood normal.        Behavior: Behavior normal.     Procedures  Procedures  ED Course / MDM   Clinical Course as of 07/14/22 1346  Tue Jul 14, 2022  1341 Patient sent from [facility] for MRI.  Discussed with patient.  She reports she originally presented because she had a episode where she felt as if she was going to faint, began after holding her arm in the air, associated with feeling hot and blurry vision and lightheadedness.  Husband helped her to the ground.  She denies similar episodes.  She had no chest pain or palpitations during this episode did not lose consciousness.  She reports this was her primary concern.  She reports that she has had the right arm numbness for some time now.  No weakness.  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  Strength is reassuring in the emergency department and she is already established care with neurosurgery.  Suspect earlier near fainting episode was likely a vasovagal or orthostatic episode, possibly related to arm positioning but low concern for cardiogenic cause.  Advise close follow-up with her primary doctor for recheck of potassium as well as for follow-up of her near fainting episode.  Discussed strict return precautions for any further episodes. Will discharge patient to home. All questions answered. Patient comfortable with plan of discharge. Return precautions discussed with patient and specified on the after visit summary. [WS]    Clinical Course User Index [WS] Lonell Grandchild, MD   Medical Decision Making Amount and/or Complexity of Data Reviewed Labs: ordered.     ICD-10-CM   1. Near syncope  R55     2. Numbness  R20.0     3. Hypokalemia  E87.6             Lonell Grandchild, MD 07/14/22 1346

## 2022-07-14 NOTE — ED Triage Notes (Signed)
Patient arrives ambulatory by POV c/o intermittent numbness and tingling to right arm when waking yesterday. Reports having issues with numbness in right arm for past couple years States she walked to the bathroom and thought she just slept on it wrong and attempted to massage her arm. Patient states a little later in the am she began to feel flush like she was going to pass out and her husband helped lower her to the ground. States symptoms have improved since yesterday.

## 2022-07-14 NOTE — Discharge Instructions (Addendum)
We evaluated you for your near fainting spell and your numbness.  Your brain MRI was normal.  Your cervical spine MRI showed arthritis, this was worse on the right and would explain your numbness.  Please follow-up with your neurosurgeon and physical therapy.  Your laboratory tests and EKG were reassuring aside from mildly low potassium.  Please try to eat high potassium foods over the next few days.  I have attached some information about what foods contain potassium.  It is difficult to say exactly what could have caused your near-fainting spell, it could have been caused by you holding your arm up, mild dehydration, or due to discomfort or pain.  I do not think we need to keep you in the hospital at this time to get more testing, but please call your primary doctor as soon as possible for follow-up, so they can both recheck your potassium and determine whether any other testing is needed.  If you develop any new symptoms, such as chest pain, difficulty breathing, palpitations, lightheadedness, weakness or any episodes of fainting, please return to the emergency department.

## 2022-07-14 NOTE — ED Provider Notes (Signed)
[place] [facility] AT [facility] Provider Note   CSN: [id number] Arrival date & time: 07/14/22  4098     History  Chief Complaint  Patient presents with   Numbness    Rachael Calderon is a 52 y.o. female.  HPI Patient reports she has had problems on and off with numbness in the right arm.  She has been seen by her doctor but no MRI has been done.  They advised that she needed to do 7 sessions of physical therapy before more diagnostic evaluation could be done.  Patient reports that yesterday she got up and her left arm was extremely numb.  She assumed it was because she had slept on it a certain way and was trying to do some of her exercises to improve the function, she reports she fairly abruptly got an intense feeling of dizziness and imbalance.  She had to call for her husband and he assisted her to the floor so she could lie down.  She reports in association with this she also had some blurred vision and felt unsteady.  They did call EMS at that time and when EMS came after assessment symptoms were improving and EMS determined she did not need transport to the hospital.  Patient reports that she rested much of the day and then went into work her night shift.  She reports she was able to work her night shift.  Today she still has some of the feeling of dizziness but not as intense as it was at onset.  The arm also has less of the numbness and it has been functional there is been no motor dysfunction.    Home Medications Prior to Admission medications   Medication Sig Start Date End Date Taking? Authorizing Provider  atorvastatin (LIPITOR) 10 MG tablet Take 10 mg by mouth daily.   Yes [provider]  cholecalciferol (VITAMIN D3) 25 MCG (1000 UT) tablet Take 1,000 Units by mouth daily.   Yes [provider]  diltiazem (CARDIZEM) 120 MG tablet Take 120 mg by mouth 4 (four) times daily.   Yes [provider]  fexofenadine (ALLEGRA) 180 MG  tablet Take 180 mg by mouth daily as needed for allergies.    Yes [provider]  hydrochlorothiazide (HYDRODIURIL) 25 MG tablet Take 1 tablet by mouth once a day in the morning 09/04/21  Yes   albuterol (PROVENTIL HFA;VENTOLIN HFA) 108 (90 BASE) MCG/ACT inhaler Inhale 2 puffs into the lungs every 6 (six) hours as needed for wheezing or shortness of breath.     [provider]  budesonide-formoterol (SYMBICORT) 160-4.5 MCG/ACT inhaler Inhale 2 puffs into the lungs 2 (two) times daily.      [provider]  diltiazem (DILT-XR) 120 MG 24 hr capsule Take 1 capsule (120 mg) by mouth in the morning on an empty stomach 07/09/22     dupilumab (DUPIXENT) 300 MG/2ML prefilled syringe Inject 2 mLs (300 mg total) into the skin every 14 days. 10/15/21   Quentin Angst, MD  gabapentin (NEURONTIN) 100 MG capsule Take 1 capsule (100 mg total) by mouth 3 (three) times daily. 05/25/22   Bedelia Person, MD  hydrochlorothiazide (HYDRODIURIL) 25 MG tablet Take 25 mg by mouth daily.    [provider]  HYDROcodone-acetaminophen (NORCO/VICODIN) 5-325 MG tablet Take 1 tablet by mouth every 6 (six) hours as needed for severe pain. 11/29/20   Wallis Bamberg, PA-C  losartan (COZAAR) 50 MG tablet Take 50 mg by  mouth daily.    [provider]  Mepolizumab (NUCALA) 100 MG/ML SOAJ Inject into the skin.    [provider]  montelukast (SINGULAIR) 10 MG tablet Take 1 tablet (10 mg total) by mouth in the morning and at bedtime. 02/23/22     Multiple Vitamins-Minerals (MULTIVITAMIN PO) Take 1 tablet by mouth daily.    [provider]  naproxen (NAPROSYN) 500 MG tablet Take 1 tablet (500 mg total) by mouth 2 (two) times daily with a meal. 11/29/20   Wallis Bamberg, PA-C  pantoprazole (PROTONIX) 40 MG tablet Take 40 mg by mouth every evening.      [provider]  phentermine (ADIPEX-P) 37.5 MG tablet Take 1 tablet (37.5 mg total) by mouth daily before breakfast.  05/27/22     phentermine (ADIPEX-P) 37.5 MG tablet Take 1 tablet by mouth daily for 30 days. 07/09/22     phentermine 15 MG capsule Take 1 capsule by mouth once daily for 14 days 09/22/21     potassium chloride (K-DUR) 10 MEQ tablet Take 10 mEq by mouth daily.    [provider]  potassium chloride SA (KLOR-CON M) 20 MEQ tablet Take 1 tablet (20 mEq) by mouth daily. 07/10/22     Probiotic Product (PROBIOTIC-10 PO) Take by mouth.    [provider]  rosuvastatin (CRESTOR) 10 MG tablet TAKE 1 TABLET BY MOUTH DAILY 07/15/21     sodium chloride (OCEAN) 0.65 % SOLN nasal spray Place 1 spray into both nostrils daily as needed for congestion.    [provider]  Tavaborole (KERYDIN) 5 % SOLN Apply 1 drop topically 1 day or 1 dose. Apply 1 drop to the toenail daily. Patient not taking: Reported on 04/26/2018 12/27/13   Delories Heinz, DPM  topiramate (TOPAMAX) 25 MG tablet Take 1 tablet (25 mg total) by mouth daily. 12/22/21         Allergies    Aspirin and Sulfonamide derivatives    Review of Systems   Review of Systems  Physical Exam Updated Vital Signs BP (!) 156/88   Pulse 73   Temp 97.6 F (36.4 C) (Oral)   Resp (!) 22   Ht 5' 6.5" (1.689 m)   Wt 106.6 kg   LMP 11/17/2021   SpO2 100%   BMI 37.36 kg/m  Physical Exam Constitutional:      Comments: Alert nontoxic well in appearance.  HENT:     Head: Normocephalic and atraumatic.     Mouth/Throat:     Pharynx: Oropharynx is clear.  Eyes:     Extraocular Movements: Extraocular movements intact.     Pupils: Pupils are equal, round, and reactive to light.  Cardiovascular:     Rate and Rhythm: Normal rate and regular rhythm.  Pulmonary:     Effort: Pulmonary effort is normal.     Breath sounds: Normal breath sounds.  Abdominal:     General: There is no distension.     Palpations: Abdomen is soft.     Tenderness: There is no abdominal tenderness. There is no guarding.  Musculoskeletal:        General: No  swelling or tenderness. Normal range of motion.     Cervical back: Neck supple.     Right lower leg: No edema.     Left lower leg: No edema.  Skin:    General: Skin is warm and dry.  Neurological:     General: No focal deficit present.     Mental Status:  She is oriented to person, place, and time.     Cranial Nerves: No cranial nerve deficit.     Sensory: No sensory deficit.     Motor: No weakness.     Coordination: Coordination normal.     Comments: Cranial nerves II through XII intact.  Normal finger-nose exam.  No visual field deficit.  Strength upper and lower extremities 5\5.  Sensation intact to light touch x 4.     ED Results / Procedures / Treatments   Labs (all labs ordered are listed, but only abnormal results are displayed) Labs Reviewed  BASIC METABOLIC PANEL - Abnormal; Notable for the following components:      Result Value   Potassium 2.9 (*)    Glucose, Bld 112 (*)    Creatinine, Ser 1.02 (*)    Calcium 8.7 (*)    All other components within normal limits  APTT - Abnormal; Notable for the following components:   aPTT 23 (*)    All other components within normal limits  CBC  PROTIME-INR  DIFFERENTIAL  RAPID URINE DRUG SCREEN, HOSP PERFORMED  URINALYSIS, ROUTINE W REFLEX MICROSCOPIC    EKG EKG Interpretation  Date/Time:  Tuesday Jul 14 2022 08:16:22 EDT Ventricular Rate:  75 PR Interval:  168 QRS Duration: 97 QT Interval:  397 QTC Calculation: 444 R Axis:   67 Text Interpretation: Sinus rhythm Atrial premature complexes Borderline T wave abnormalities agree. no old comparison Confirmed by Arby Barrette [id number]) on 07/14/2022 10:18:01 AM  Radiology CT HEAD WO CONTRAST  Result Date: 07/14/2022 CLINICAL DATA:  Neuro deficit, acute, stroke suspected. Complaining of intermittent numbness and tingling to right arm. EXAM: CT HEAD WITHOUT CONTRAST TECHNIQUE: Contiguous axial images were obtained from the base of the skull through the vertex without  intravenous contrast. RADIATION DOSE REDUCTION: This exam was performed according to the departmental dose-optimization program which includes automated exposure control, adjustment of the mA and/or kV according to patient size and/or use of iterative reconstruction technique. COMPARISON:  None Available. FINDINGS: Brain: No acute intracranial hemorrhage. Gray-white differentiation is preserved. No hydrocephalus or extra-axial collection. No mass effect or midline shift. Vascular: No hyperdense vessel or unexpected calcification. Skull: No calvarial fracture or suspicious bone lesion. Skull base is unremarkable. Sinuses/Orbits: Prior functional endoscopic sinus surgery. Orbits are unremarkable. Mastoids are well aerated. Other: None. IMPRESSION: No acute intracranial abnormality. Electronically Signed   By: Orvan Falconer M.D.   On: 07/14/2022 09:46    Procedures Procedures    Medications Ordered in ED Medications  potassium chloride 10 mEq in 100 mL IVPB (10 mEq Intravenous Patient Refused/Not Given 07/14/22 1008)  potassium chloride SA (KLOR-CON M) CR tablet 40 mEq (40 mEq Oral Given 07/14/22 1001)    ED Course/ Medical Decision Making/ A&P                             Medical Decision Making Amount and/or Complexity of Data Reviewed Labs: ordered. Radiology: ordered.  Risk Prescription drug management.   Patient been having problems with paresthesia and numbness of the right arm intermittently.  She has not had very extensive workup for this.  She also reports that rarely she will feel like her legs might have some sense of numbness as well but does not ever experience a motor deficit.  Symptoms are concerning for possible cervical compression or stenosis.  Patient does not endorse significant neck pain.  Differential diagnosis also includes multiple sclerosis or  stroke.  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  Yesterday morning after sleeping for a while she awakened and the numbness in  the arm was worse than usual, she was trying to do exercises and massage to improve this but then got to a very strange feeling and almost intense dizziness near syncopal episode.  She reports she felt like her vision was blurry and she had a somewhat spinning quality dizziness.  EMS did a home assessment and determined that she did not need transport.  Description is concerning for possible CVA\TIA.  She did recover and was able to completely work shift.  This time plan will be for stroke workup and evaluation for metabolic derangement.  CT head negative for acute findings.  Metabolic panel with potassium of 2.9 normal GFR glucose 112.  CBC normal.  Patient reports she is aware of some hypokalemia and was prescribed potassium by her doctor.  She has been trying to take an additional dietary potassium but had not yet started the potassium supplements.  Patient was given 40 mill equivalents p.o.  She refused IV potassium not wanting to increase time of stay.  At this point I do feel patient needs MRI.  As some concern for MS with description of arm numbness without motor deficit that has waxed and waned for some time and reference to occasional leg symptoms.  Also concern for stroke.  Patient does have stroke risk factors with history of hypertension and yesterday's event possibly representing TIA.  Will transfer for MRI with and without contrast head and neck to rule out MS\CVA\cervical stenosis or impingement .  Patient advises that she strongly wishes to go by private vehicle.  She advises that she did work a full night shift and did not have difficulty with symptoms.  At this time will agree for private vehicle transfer to [facility] emergency department for completing diagnostic evaluation with MRIs.  If MRI is negative for acute or actionable findings, I feel patient was stable for discharge.  She already has a prescription waiting for potassium supplement.        Final Clinical Impression(s)  / ED Diagnoses Final diagnoses:  None    Rx / DC Orders ED Discharge Orders     None         Arby Barrette, MD 07/14/22 1052

## 2022-07-14 NOTE — ED Notes (Signed)
Patient transported to MRI 

## 2022-07-14 NOTE — ED Notes (Signed)
Patient transported to CT 

## 2022-07-16 ENCOUNTER — Other Ambulatory Visit (HOSPITAL_COMMUNITY): Payer: Self-pay

## 2022-07-16 DIAGNOSIS — E876 Hypokalemia: Secondary | ICD-10-CM | POA: Diagnosis not present

## 2022-07-16 DIAGNOSIS — I10 Essential (primary) hypertension: Secondary | ICD-10-CM | POA: Diagnosis not present

## 2022-07-16 DIAGNOSIS — R7303 Prediabetes: Secondary | ICD-10-CM | POA: Diagnosis not present

## 2022-07-16 DIAGNOSIS — Z6836 Body mass index (BMI) 36.0-36.9, adult: Secondary | ICD-10-CM | POA: Diagnosis not present

## 2022-07-16 MED ORDER — PHENTERMINE HCL 37.5 MG PO TABS: 37.5000 mg | ORAL_TABLET | Freq: Every day | ORAL | 0 refills | Status: AC

## 2022-07-17 ENCOUNTER — Other Ambulatory Visit (HOSPITAL_COMMUNITY): Payer: Self-pay

## 2022-07-22 ENCOUNTER — Other Ambulatory Visit (HOSPITAL_COMMUNITY): Payer: Self-pay

## 2022-07-23 ENCOUNTER — Other Ambulatory Visit (HOSPITAL_COMMUNITY): Payer: Self-pay

## 2022-08-17 ENCOUNTER — Other Ambulatory Visit (HOSPITAL_COMMUNITY): Payer: Self-pay

## 2022-08-17 MED ORDER — PHENTERMINE HCL 37.5 MG PO TABS
37.5000 mg | ORAL_TABLET | Freq: Every day | ORAL | 0 refills | Status: DC
  Filled 2022-08-17: qty 30, 30d supply, fill #0

## 2022-08-18 ENCOUNTER — Other Ambulatory Visit (HOSPITAL_COMMUNITY): Payer: Self-pay

## 2022-08-18 DIAGNOSIS — J8283 Eosinophilic asthma: Secondary | ICD-10-CM | POA: Diagnosis not present

## 2022-08-18 DIAGNOSIS — J339 Nasal polyp, unspecified: Secondary | ICD-10-CM | POA: Diagnosis not present

## 2022-08-18 DIAGNOSIS — Z886 Allergy status to analgesic agent status: Secondary | ICD-10-CM | POA: Diagnosis not present

## 2022-08-18 DIAGNOSIS — J4550 Severe persistent asthma, uncomplicated: Secondary | ICD-10-CM | POA: Diagnosis not present

## 2022-08-18 DIAGNOSIS — J45909 Unspecified asthma, uncomplicated: Secondary | ICD-10-CM | POA: Diagnosis not present

## 2022-08-18 MED ORDER — PREDNISONE 10 MG PO TABS
ORAL_TABLET | ORAL | 0 refills | Status: AC
  Filled 2022-08-18: qty 40, 16d supply, fill #0

## 2022-08-21 ENCOUNTER — Other Ambulatory Visit (HOSPITAL_COMMUNITY): Payer: Self-pay

## 2022-08-25 ENCOUNTER — Other Ambulatory Visit (HOSPITAL_COMMUNITY): Payer: Self-pay

## 2022-08-25 DIAGNOSIS — R7303 Prediabetes: Secondary | ICD-10-CM | POA: Diagnosis not present

## 2022-08-25 DIAGNOSIS — Z6836 Body mass index (BMI) 36.0-36.9, adult: Secondary | ICD-10-CM | POA: Diagnosis not present

## 2022-08-25 DIAGNOSIS — I10 Essential (primary) hypertension: Secondary | ICD-10-CM | POA: Diagnosis not present

## 2022-08-25 MED ORDER — PHENTERMINE HCL 37.5 MG PO TABS
37.5000 mg | ORAL_TABLET | Freq: Every day | ORAL | 0 refills | Status: AC
  Filled 2022-10-14: qty 30, 30d supply, fill #0

## 2022-09-14 ENCOUNTER — Other Ambulatory Visit (HOSPITAL_COMMUNITY): Payer: Self-pay

## 2022-09-16 ENCOUNTER — Other Ambulatory Visit: Payer: Self-pay

## 2022-09-17 ENCOUNTER — Other Ambulatory Visit (HOSPITAL_COMMUNITY): Payer: Self-pay

## 2022-09-18 ENCOUNTER — Other Ambulatory Visit (HOSPITAL_COMMUNITY): Payer: Self-pay

## 2022-09-22 ENCOUNTER — Other Ambulatory Visit (HOSPITAL_COMMUNITY): Payer: Self-pay

## 2022-09-23 ENCOUNTER — Ambulatory Visit: Payer: Commercial Managed Care - PPO | Admitting: Pharmacist

## 2022-09-23 ENCOUNTER — Other Ambulatory Visit (HOSPITAL_COMMUNITY): Payer: Self-pay

## 2022-09-23 ENCOUNTER — Other Ambulatory Visit: Payer: Self-pay

## 2022-09-23 DIAGNOSIS — Z79899 Other long term (current) drug therapy: Secondary | ICD-10-CM

## 2022-09-23 MED ORDER — DUPIXENT 300 MG/2ML ~~LOC~~ SOSY
PREFILLED_SYRINGE | SUBCUTANEOUS | 11 refills | Status: DC
  Filled 2022-09-23: qty 4, 28d supply, fill #0
  Filled 2022-10-14: qty 4, 28d supply, fill #1
  Filled 2022-11-11: qty 4, 28d supply, fill #2
  Filled 2022-12-08: qty 4, 28d supply, fill #3
  Filled 2022-12-31 (×2): qty 4, 28d supply, fill #4
  Filled 2023-02-08: qty 4, 28d supply, fill #5
  Filled 2023-03-12: qty 4, 28d supply, fill #6
  Filled 2023-04-09 (×2): qty 4, 28d supply, fill #7
  Filled 2023-05-14: qty 4, 28d supply, fill #8
  Filled 2023-06-08: qty 4, 28d supply, fill #9
  Filled 2023-07-16: qty 4, 28d supply, fill #10
  Filled 2023-08-10: qty 4, 28d supply, fill #11

## 2022-09-23 MED ORDER — DUPIXENT 300 MG/2ML ~~LOC~~ SOSY: PREFILLED_SYRINGE | SUBCUTANEOUS | 11 refills | Status: DC

## 2022-09-23 NOTE — Progress Notes (Signed)
   S: Patient presents for review of their specialty medication therapy.  Patient is currently taking Dupixent for asthma. Patient is managed by Dr. Christell Constant for this.   Adherence: confirms. Has been taking for several years  Efficacy: works well for her.   Dosing: 300 mg q14 days  Dose adjustments: Renal: no dose adjustments (has not been studied) Hepatic: no dose adjustments (has not been studied)  Drug-drug interactions: none  Monitoring: S/sx of infection: none S/sx of hypersensitivity: none S/sx of ocular effects: none S/sx of eosinophilia/vasculitis: none  O:     Lab Results  Component Value Date   WBC 4.7 07/14/2022   HGB 12.8 07/14/2022   HCT 38.1 07/14/2022   MCV 81.9 07/14/2022   PLT 240 07/14/2022      Chemistry      Component Value Date/Time   NA 136 07/14/2022 0826   K 2.9 (L) 07/14/2022 0826   CL 100 07/14/2022 0826   CO2 27 07/14/2022 0826   BUN 14 07/14/2022 0826   CREATININE 1.02 (H) 07/14/2022 0826      Component Value Date/Time   CALCIUM 8.7 (L) 07/14/2022 0826       A/P: 1. Medication review: Patient currently on Dupixent for asthma. Reviewed the medication with the patient, including the following: Dupixent is a monoclonal antibody used for the treatment of asthma or atopic dermatitis. Patient educated on purpose, proper use and potential adverse effects of Dupixent. Possible adverse effects include increased risk of infection, ocular effects, vasculitis/eosinophilia, and hypersensitivity reactions. Administer as a SubQ injection and rotate sites. Allow the medication to reach room temp prior to administration (45 mins for 300 mg syringe or 30 min for 200 mg syringe). Do not shake. Discard any unused portion. No recommendations for any changes.   Butch Penny, PharmD, Patsy [place], CPP Clinical Pharmacist [facility] [phone]

## 2022-09-28 ENCOUNTER — Other Ambulatory Visit (HOSPITAL_COMMUNITY): Payer: Self-pay

## 2022-10-14 ENCOUNTER — Other Ambulatory Visit (HOSPITAL_COMMUNITY): Payer: Self-pay

## 2022-10-14 ENCOUNTER — Other Ambulatory Visit: Payer: Self-pay

## 2022-10-15 ENCOUNTER — Other Ambulatory Visit: Payer: Self-pay

## 2022-10-15 ENCOUNTER — Other Ambulatory Visit (HOSPITAL_COMMUNITY): Payer: Self-pay

## 2022-10-15 DIAGNOSIS — I10 Essential (primary) hypertension: Secondary | ICD-10-CM | POA: Diagnosis not present

## 2022-10-15 DIAGNOSIS — R7303 Prediabetes: Secondary | ICD-10-CM | POA: Diagnosis not present

## 2022-10-15 DIAGNOSIS — Z6836 Body mass index (BMI) 36.0-36.9, adult: Secondary | ICD-10-CM | POA: Diagnosis not present

## 2022-10-15 MED ORDER — PHENTERMINE HCL 37.5 MG PO TABS
37.5000 mg | ORAL_TABLET | Freq: Every day | ORAL | 1 refills | Status: AC
  Filled 2022-10-15: qty 30, 30d supply, fill #0

## 2022-10-21 ENCOUNTER — Other Ambulatory Visit (HOSPITAL_COMMUNITY): Payer: Self-pay

## 2022-11-07 ENCOUNTER — Encounter (HOSPITAL_COMMUNITY): Payer: Self-pay

## 2022-11-09 ENCOUNTER — Other Ambulatory Visit: Payer: Self-pay

## 2022-11-10 ENCOUNTER — Other Ambulatory Visit (HOSPITAL_COMMUNITY): Payer: Self-pay

## 2022-11-11 ENCOUNTER — Other Ambulatory Visit: Payer: Self-pay

## 2022-11-11 NOTE — Progress Notes (Signed)
Specialty Pharmacy Refill Coordination Note  Rachael Calderon is a 52 y.o. female contacted today regarding refills of specialty medication(s) Dupilumab .  Patient requested [name] at [facility] at [place]  on 11/17/22   Medication will be filled on 11/16/22.

## 2022-11-12 ENCOUNTER — Other Ambulatory Visit: Payer: Self-pay

## 2022-11-26 ENCOUNTER — Other Ambulatory Visit (HOSPITAL_COMMUNITY): Payer: Self-pay

## 2022-11-26 MED ORDER — PHENTERMINE HCL 37.5 MG PO TABS
37.5000 mg | ORAL_TABLET | Freq: Every day | ORAL | 1 refills | Status: AC
  Filled 2022-11-26: qty 30, 30d supply, fill #0

## 2022-11-27 ENCOUNTER — Other Ambulatory Visit (HOSPITAL_COMMUNITY): Payer: Self-pay

## 2022-12-04 ENCOUNTER — Other Ambulatory Visit (HOSPITAL_COMMUNITY): Payer: Self-pay

## 2022-12-07 ENCOUNTER — Other Ambulatory Visit: Payer: Self-pay

## 2022-12-08 ENCOUNTER — Encounter (HOSPITAL_COMMUNITY): Payer: Self-pay

## 2022-12-08 ENCOUNTER — Other Ambulatory Visit: Payer: Self-pay

## 2022-12-08 NOTE — Progress Notes (Signed)
Specialty Pharmacy Refill Coordination Note  Rachael Calderon is a 52 y.o. female contacted today regarding refills of specialty medication(s) Dupilumab   Patient requested [name] at [facility] at [place] date: 12/15/22   Medication will be filled on 12/14/22.

## 2022-12-08 NOTE — Progress Notes (Signed)
Specialty Pharmacy Ongoing Clinical Assessment Note  Rachael Calderon is a 52 y.o. female who is being followed by the specialty pharmacy service for RxSp Asthma/COPD   Patient's specialty medication(s) reviewed today: Dupilumab   Missed doses in the last 4 weeks: 0   Patient/Caregiver did not have any additional questions or concerns.   Therapeutic benefit summary: Patient is achieving benefit   Adverse events/side effects summary: No adverse events/side effects   Patient's therapy is appropriate to: Continue    Goals Addressed             This Visit's Progress    Reduce disease symptoms including coughing and shortness of breath       Patient is on track. Patient will maintain adherence         Follow up:  6 months  Otto Herb Specialty Pharmacist

## 2022-12-22 ENCOUNTER — Other Ambulatory Visit (HOSPITAL_COMMUNITY): Payer: Self-pay

## 2022-12-23 ENCOUNTER — Other Ambulatory Visit (HOSPITAL_COMMUNITY): Payer: Self-pay

## 2022-12-23 MED ORDER — HYDROCHLOROTHIAZIDE 25 MG PO TABS
25.0000 mg | ORAL_TABLET | Freq: Every day | ORAL | 0 refills | Status: DC
  Filled 2022-12-23: qty 90, 90d supply, fill #0

## 2022-12-25 ENCOUNTER — Other Ambulatory Visit (HOSPITAL_COMMUNITY): Payer: Self-pay

## 2022-12-29 ENCOUNTER — Other Ambulatory Visit (HOSPITAL_COMMUNITY): Payer: Self-pay

## 2022-12-30 ENCOUNTER — Other Ambulatory Visit: Payer: Self-pay

## 2022-12-31 ENCOUNTER — Other Ambulatory Visit: Payer: Self-pay

## 2022-12-31 NOTE — Progress Notes (Signed)
Specialty Pharmacy Refill Coordination Note  Rachael Calderon is a 52 y.o. female contacted today regarding refills of specialty medication(s) Dupilumab   Patient requested [name] at [facility] at [place] date: 01/08/23   Medication will be filled on 01/08/23.

## 2023-01-07 ENCOUNTER — Other Ambulatory Visit: Payer: Self-pay

## 2023-01-07 ENCOUNTER — Other Ambulatory Visit (HOSPITAL_COMMUNITY): Payer: Self-pay

## 2023-01-07 MED ORDER — DILTIAZEM HCL ER 120 MG PO CP24
120.0000 mg | ORAL_CAPSULE | Freq: Every day | ORAL | 0 refills | Status: DC
  Filled 2023-01-07: qty 90, 90d supply, fill #0

## 2023-01-08 ENCOUNTER — Other Ambulatory Visit: Payer: Self-pay

## 2023-01-11 ENCOUNTER — Other Ambulatory Visit (HOSPITAL_COMMUNITY): Payer: Self-pay

## 2023-01-11 DIAGNOSIS — E7800 Pure hypercholesterolemia, unspecified: Secondary | ICD-10-CM | POA: Diagnosis not present

## 2023-01-11 DIAGNOSIS — I10 Essential (primary) hypertension: Secondary | ICD-10-CM | POA: Diagnosis not present

## 2023-01-11 DIAGNOSIS — Z6836 Body mass index (BMI) 36.0-36.9, adult: Secondary | ICD-10-CM | POA: Diagnosis not present

## 2023-01-11 DIAGNOSIS — R7303 Prediabetes: Secondary | ICD-10-CM | POA: Diagnosis not present

## 2023-01-11 MED ORDER — PHENTERMINE HCL 37.5 MG PO TABS
37.5000 mg | ORAL_TABLET | Freq: Every day | ORAL | 1 refills | Status: AC
  Filled 2023-01-11: qty 30, 30d supply, fill #0

## 2023-01-12 DIAGNOSIS — N898 Other specified noninflammatory disorders of vagina: Secondary | ICD-10-CM | POA: Insufficient documentation

## 2023-02-08 ENCOUNTER — Other Ambulatory Visit (HOSPITAL_COMMUNITY): Payer: Self-pay

## 2023-02-08 ENCOUNTER — Other Ambulatory Visit (HOSPITAL_COMMUNITY): Payer: Self-pay | Admitting: Pharmacy Technician

## 2023-02-08 NOTE — Progress Notes (Signed)
Specialty Pharmacy Refill Coordination Note  Rachael Calderon is a 52 y.o. female contacted today regarding refills of specialty medication(s) Dupilumab (Dupixent)   Patient requested [name] at [facility] at [place] date: 02/19/23   Medication will be filled on 02/18/23.

## 2023-02-18 ENCOUNTER — Other Ambulatory Visit: Payer: Self-pay

## 2023-02-18 ENCOUNTER — Other Ambulatory Visit (HOSPITAL_COMMUNITY): Payer: Self-pay

## 2023-02-23 ENCOUNTER — Other Ambulatory Visit (HOSPITAL_COMMUNITY): Payer: Self-pay

## 2023-02-24 ENCOUNTER — Other Ambulatory Visit (HOSPITAL_COMMUNITY): Payer: Self-pay

## 2023-02-24 MED ORDER — PHENTERMINE HCL 37.5 MG PO TABS
37.5000 mg | ORAL_TABLET | Freq: Every day | ORAL | 1 refills | Status: AC
  Filled 2023-02-24: qty 30, 30d supply, fill #0

## 2023-02-25 ENCOUNTER — Other Ambulatory Visit (HOSPITAL_COMMUNITY): Payer: Self-pay

## 2023-02-25 DIAGNOSIS — J324 Chronic pansinusitis: Secondary | ICD-10-CM | POA: Diagnosis not present

## 2023-02-25 DIAGNOSIS — J4550 Severe persistent asthma, uncomplicated: Secondary | ICD-10-CM | POA: Diagnosis not present

## 2023-02-25 DIAGNOSIS — Z886 Allergy status to analgesic agent status: Secondary | ICD-10-CM | POA: Diagnosis not present

## 2023-02-25 DIAGNOSIS — J8283 Eosinophilic asthma: Secondary | ICD-10-CM | POA: Diagnosis not present

## 2023-02-25 DIAGNOSIS — J339 Nasal polyp, unspecified: Secondary | ICD-10-CM | POA: Diagnosis not present

## 2023-02-25 DIAGNOSIS — J45909 Unspecified asthma, uncomplicated: Secondary | ICD-10-CM | POA: Diagnosis not present

## 2023-02-25 MED ORDER — PREDNISONE 10 MG PO TABS
ORAL_TABLET | ORAL | 1 refills | Status: AC
  Filled 2023-02-25: qty 40, 16d supply, fill #0
  Filled 2023-03-08: qty 40, 16d supply, fill #1

## 2023-03-08 ENCOUNTER — Other Ambulatory Visit (HOSPITAL_COMMUNITY): Payer: Self-pay

## 2023-03-12 ENCOUNTER — Other Ambulatory Visit: Payer: Self-pay

## 2023-03-12 NOTE — Progress Notes (Signed)
Specialty Pharmacy Refill Coordination Note  Rachael Calderon is a 53 y.o. female contacted today regarding refills of specialty medication(s) Dupilumab (Dupixent)   Patient requested [name] at [facility] at [place] date: 03/17/23   Medication will be filled on 01.28.25.

## 2023-03-16 ENCOUNTER — Other Ambulatory Visit: Payer: Self-pay

## 2023-03-17 ENCOUNTER — Other Ambulatory Visit (HOSPITAL_COMMUNITY): Payer: Self-pay

## 2023-03-18 ENCOUNTER — Other Ambulatory Visit (HOSPITAL_COMMUNITY): Payer: Self-pay

## 2023-03-18 MED ORDER — HYDROCHLOROTHIAZIDE 25 MG PO TABS
25.0000 mg | ORAL_TABLET | Freq: Every day | ORAL | 0 refills | Status: DC
  Filled 2023-03-18: qty 90, 90d supply, fill #0

## 2023-03-22 ENCOUNTER — Other Ambulatory Visit (HOSPITAL_COMMUNITY): Payer: Self-pay

## 2023-03-22 ENCOUNTER — Other Ambulatory Visit: Payer: Self-pay

## 2023-03-22 MED ORDER — MONTELUKAST SODIUM 10 MG PO TABS
10.0000 mg | ORAL_TABLET | Freq: Every morning | ORAL | 11 refills | Status: DC
  Filled 2023-03-22: qty 60, 30d supply, fill #0
  Filled 2023-04-15 – 2023-05-08 (×3): qty 60, 30d supply, fill #1
  Filled 2023-06-14: qty 60, 30d supply, fill #2
  Filled 2023-07-14: qty 60, 30d supply, fill #3
  Filled 2023-08-13: qty 60, 30d supply, fill #4
  Filled 2023-09-10 – 2023-10-13 (×4): qty 60, 30d supply, fill #5
  Filled 2023-11-12 – 2023-12-04 (×3): qty 60, 30d supply, fill #6
  Filled 2024-01-01: qty 60, 30d supply, fill #7

## 2023-04-05 ENCOUNTER — Other Ambulatory Visit (HOSPITAL_COMMUNITY): Payer: Self-pay

## 2023-04-05 ENCOUNTER — Other Ambulatory Visit: Payer: Self-pay

## 2023-04-05 DIAGNOSIS — H524 Presbyopia: Secondary | ICD-10-CM | POA: Diagnosis not present

## 2023-04-05 MED ORDER — DILTIAZEM HCL ER 120 MG PO CP24
120.0000 mg | ORAL_CAPSULE | Freq: Every day | ORAL | 0 refills | Status: DC
  Filled 2023-04-05: qty 90, 90d supply, fill #0

## 2023-04-07 ENCOUNTER — Ambulatory Visit: Payer: Commercial Managed Care - PPO | Admitting: Podiatry

## 2023-04-07 ENCOUNTER — Other Ambulatory Visit (HOSPITAL_COMMUNITY): Payer: Self-pay

## 2023-04-07 ENCOUNTER — Other Ambulatory Visit: Payer: Self-pay

## 2023-04-07 DIAGNOSIS — L989 Disorder of the skin and subcutaneous tissue, unspecified: Secondary | ICD-10-CM

## 2023-04-07 MED ORDER — PHENTERMINE HCL 37.5 MG PO TABS
37.5000 mg | ORAL_TABLET | Freq: Every day | ORAL | 0 refills | Status: AC
  Filled 2023-04-07: qty 30, 30d supply, fill #0

## 2023-04-07 MED ORDER — TOPIRAMATE 25 MG PO TABS
25.0000 mg | ORAL_TABLET | Freq: Every day | ORAL | 1 refills | Status: AC
  Filled 2023-04-07: qty 30, 30d supply, fill #0
  Filled 2023-05-01 – 2023-05-15 (×2): qty 30, 30d supply, fill #1

## 2023-04-07 NOTE — Progress Notes (Signed)
Subjective:  Patient ID: Rachael Calderon, female    DOB: Apr 01, 1970,  MRN: [id number]  Chief Complaint  Patient presents with   Callouses    Callus on bottom of right foot     53 y.o. female presents with the above complaint.  Patient presents with complaint of right submetatarsal 3 porokeratotic lesion/benign skin lesion painful to touch is progressive gotten worse worse with ambulation worse with pressure patient would like to discuss treatment options for has not seen and was prior to seeing me.   Review of Systems: Negative except as noted in the HPI. Denies N/V/F/Ch.  Past Medical History:  Diagnosis Date   Asthma    GERD (gastroesophageal reflux disease)    CONTROLLED W/ PROTONIX   Hypertension    SUI (stress urinary incontinence, female)     Current Outpatient Medications:    EPINEPHrine 0.3 mg/0.3 mL IJ SOAJ injection, Inject 0.3 mLs (0.3 mg total) into the muscle once as needed for up to 1 dose for Anaphylaxis., Disp: , Rfl:    ferrous sulfate 325 (65 FE) MG tablet, 1 tablet, Disp: , Rfl:    fluconazole (DIFLUCAN) 150 MG tablet, Take 1 tablet every 72 hours by oral route., Disp: , Rfl:    ipratropium-albuterol (DUONEB) 0.5-2.5 (3) MG/3ML SOLN, Inhale into the lungs., Disp: , Rfl:    albuterol (PROVENTIL HFA;VENTOLIN HFA) 108 (90 BASE) MCG/ACT inhaler, Inhale 2 puffs into the lungs every 6 (six) hours as needed for wheezing or shortness of breath. , Disp: , Rfl:    atorvastatin (LIPITOR) 10 MG tablet, Take 10 mg by mouth daily., Disp: , Rfl:    budesonide-formoterol (SYMBICORT) 160-4.5 MCG/ACT inhaler, Inhale 2 puffs into the lungs 2 (two) times daily.  , Disp: , Rfl:    cholecalciferol (VITAMIN D3) 25 MCG (1000 UT) tablet, Take 1,000 Units by mouth daily., Disp: , Rfl:    diltiazem (CARDIZEM) 120 MG tablet, Take 120 mg by mouth 4 (four) times daily., Disp: , Rfl:    diltiazem (DILT-XR) 120 MG 24 hr capsule, Take 1 capsule (120 mg total) by mouth daily., Disp: 90 capsule,  Rfl: 0   dupilumab (DUPIXENT) 300 MG/2ML prefilled syringe, Inject 2 mL (300 mg total) under the skin every 14 (fourteen) days., Disp: 4 mL, Rfl: 11   fexofenadine (ALLEGRA) 180 MG tablet, Take 180 mg by mouth daily as needed for allergies. , Disp: , Rfl:    gabapentin (NEURONTIN) 100 MG capsule, Take 1 capsule (100 mg total) by mouth 3 (three) times daily., Disp: 90 capsule, Rfl: 0   hydrochlorothiazide (HYDRODIURIL) 25 MG tablet, Take 25 mg by mouth daily., Disp: , Rfl:    hydrochlorothiazide (HYDRODIURIL) 25 MG tablet, Take 1 tablet (25 mg total) by mouth daily., Disp: 90 tablet, Rfl: 0   HYDROcodone-acetaminophen (NORCO/VICODIN) 5-325 MG tablet, Take 1 tablet by mouth every 6 (six) hours as needed for severe pain., Disp: 15 tablet, Rfl: 0   Lactobacillus Rhamnosus, GG, (CULTURELLE) CAPS, See administration instructions., Disp: , Rfl:    losartan (COZAAR) 50 MG tablet, Take 50 mg by mouth daily., Disp: , Rfl:    montelukast (SINGULAIR) 10 MG tablet, Take 1 tablet (10 mg total) by mouth in the morning and at bedtime, Disp: 60 tablet, Rfl: 11   Multiple Vitamins-Minerals (MULTIVITAMIN PO), Take 1 tablet by mouth daily., Disp: , Rfl:    naproxen (NAPROSYN) 500 MG tablet, Take 1 tablet (500 mg total) by mouth 2 (two) times daily with a meal., Disp: 30 tablet,  Rfl: 0   pantoprazole (PROTONIX) 40 MG tablet, Take 40 mg by mouth every evening.  , Disp: , Rfl:    phentermine (ADIPEX-P) 37.5 MG tablet, Take 1 tablet (37.5 mg total) by mouth daily before breakfast., Disp: 30 tablet, Rfl: 1   phentermine (ADIPEX-P) 37.5 MG tablet, Take 1 tablet (37.5 mg total) by mouth daily before breakfast for 30 days., Disp: 30 tablet, Rfl: 0   phentermine (ADIPEX-P) 37.5 MG tablet, Take 1 tablet (37.5 mg total) by mouth daily before breakfast., Disp: 30 tablet, Rfl: 0   phentermine (ADIPEX-P) 37.5 MG tablet, Take 1 tablet (37.5 mg total) by mouth daily before breakfast, Disp: 30 tablet, Rfl: 1   phentermine (ADIPEX-P)  37.5 MG tablet, Take 1 tablet (37.5 mg total) by mouth daily before breakfast., Disp: 30 tablet, Rfl: 1   phentermine (ADIPEX-P) 37.5 MG tablet, Take 1 tablet (37.5 mg total) by mouth daily before breakfast., Disp: 30 tablet, Rfl: 1   phentermine (ADIPEX-P) 37.5 MG tablet, Take 1 tablet (37.5 mg total) by mouth daily before breakfast., Disp: 30 tablet, Rfl: 1   phentermine 15 MG capsule, Take 1 capsule by mouth once daily for 14 days, Disp: 14 capsule, Rfl: 0   potassium chloride (K-DUR) 10 MEQ tablet, Take 10 mEq by mouth daily., Disp: , Rfl:    potassium chloride SA (KLOR-CON M) 20 MEQ tablet, Take 1 tablet (20 mEq) by mouth daily., Disp: 90 tablet, Rfl: 3   Probiotic Product (PROBIOTIC-10 PO), Take by mouth., Disp: , Rfl:    rosuvastatin (CRESTOR) 10 MG tablet, TAKE 1 TABLET BY MOUTH DAILY, Disp: 90 tablet, Rfl: 1   sodium chloride (OCEAN) 0.65 % SOLN nasal spray, Place 1 spray into both nostrils daily as needed for congestion., Disp: , Rfl:    Tavaborole (KERYDIN) 5 % SOLN, Apply 1 drop topically 1 day or 1 dose. Apply 1 drop to the toenail daily. (Patient not taking: Reported on 04/26/2018), Disp: 1 Bottle, Rfl: 3   topiramate (TOPAMAX) 25 MG tablet, Take 1 tablet (25 mg total) by mouth daily., Disp: 30 tablet, Rfl: 0  Social History   Tobacco Use  Smoking Status Never  Smokeless Tobacco Never    Allergies  Allergen Reactions   Aspirin Shortness Of Breath   Losartan Shortness Of Breath    Other reaction(s): difficulty breathing   Nsaids Swelling    Other Reaction(s): Other  Other reaction(s): Other   Atorvastatin     Other Reaction(s): Other  Other reaction(s): body aches   Sulfonamide Derivatives Rash    Other Reaction(s): Unknown  Other reaction(s): Unknown   Objective:  There were no vitals filed for this visit. There is no height or weight on file to calculate BMI. Constitutional Well developed. Well nourished.  Vascular Dorsalis pedis pulses palpable  bilaterally. Posterior tibial pulses palpable bilaterally. Capillary refill normal to all digits.  No cyanosis or clubbing noted. Pedal hair growth normal.  Neurologic Normal speech. Oriented to person, place, and time. Epicritic sensation to light touch grossly present bilaterally.  Dermatologic Right submetatarsal 3 benign skin lesion painful to touch central nucleated core noted.  Orthopedic: Normal joint ROM without pain or crepitus bilaterally. No visible deformities. No bony tenderness.   Radiographs: None Assessment:   1. Benign skin lesion    Plan:  Patient was evaluated and treated and all questions answered.  Right submetatarsal 3 benign skin lesion --Lesion was debrided today without complications. Hemostasis was achieved and the area was cleaned. Cantharone was applied  followed by an occlusive bandage. Post procedure complications were discussed. Monitor for signs or symptoms of infection and directed to call the office mainly should any occur.   No follow-ups on file.

## 2023-04-09 ENCOUNTER — Other Ambulatory Visit: Payer: Self-pay

## 2023-04-09 DIAGNOSIS — Z1231 Encounter for screening mammogram for malignant neoplasm of breast: Secondary | ICD-10-CM | POA: Diagnosis not present

## 2023-04-09 NOTE — Progress Notes (Signed)
Specialty Pharmacy Refill Coordination Note  Rachael Calderon is a 53 y.o. female contacted today regarding refills of specialty medication(s) Dupilumab (Dupixent)   Patient requested [name] at [facility] at [place] date: 04/16/23   Medication will be filled on 02.27.25.

## 2023-04-15 ENCOUNTER — Other Ambulatory Visit: Payer: Self-pay

## 2023-04-16 ENCOUNTER — Other Ambulatory Visit (HOSPITAL_COMMUNITY): Payer: Self-pay

## 2023-04-21 ENCOUNTER — Ambulatory Visit: Payer: Commercial Managed Care - PPO | Admitting: Podiatry

## 2023-04-21 DIAGNOSIS — L989 Disorder of the skin and subcutaneous tissue, unspecified: Secondary | ICD-10-CM

## 2023-04-21 DIAGNOSIS — M542 Cervicalgia: Secondary | ICD-10-CM | POA: Insufficient documentation

## 2023-04-21 NOTE — Progress Notes (Signed)
 Subjective:  Patient ID: Rachael Calderon, female    DOB: 06-19-1970,  MRN: [id number]  Chief Complaint  Patient presents with   Benign skin lesion    Pt stated that she is doing much better     53 y.o. female presents with the above complaint.  Patient presents with complaint of right submetatarsal 3 porokeratotic lesion/benign skin lesion painful to touch is progressive gotten worse worse with ambulation worse with pressure patient would like to discuss treatment options for has not seen and was prior to seeing me.   Review of Systems: Negative except as noted in the HPI. Denies N/V/F/Ch.  Past Medical History:  Diagnosis Date   Asthma    GERD (gastroesophageal reflux disease)    CONTROLLED W/ PROTONIX   Hypertension    SUI (stress urinary incontinence, female)     Current Outpatient Medications:    albuterol (PROVENTIL HFA;VENTOLIN HFA) 108 (90 BASE) MCG/ACT inhaler, Inhale 2 puffs into the lungs every 6 (six) hours as needed for wheezing or shortness of breath. , Disp: , Rfl:    atorvastatin (LIPITOR) 10 MG tablet, Take 10 mg by mouth daily., Disp: , Rfl:    budesonide-formoterol (SYMBICORT) 160-4.5 MCG/ACT inhaler, Inhale 2 puffs into the lungs 2 (two) times daily.  , Disp: , Rfl:    cholecalciferol (VITAMIN D3) 25 MCG (1000 UT) tablet, Take 1,000 Units by mouth daily., Disp: , Rfl:    diltiazem (CARDIZEM) 120 MG tablet, Take 120 mg by mouth 4 (four) times daily., Disp: , Rfl:    diltiazem (DILT-XR) 120 MG 24 hr capsule, Take 1 capsule (120 mg total) by mouth daily., Disp: 90 capsule, Rfl: 0   diltiazem (TIAZAC) 180 MG 24 hr capsule, , Disp: , Rfl:    dupilumab (DUPIXENT) 300 MG/2ML prefilled syringe, Inject 2 mL (300 mg total) under the skin every 14 (fourteen) days., Disp: 4 mL, Rfl: 11   EPINEPHrine 0.3 mg/0.3 mL IJ SOAJ injection, Inject 0.3 mLs (0.3 mg total) into the muscle once as needed for up to 1 dose for Anaphylaxis., Disp: , Rfl:    ferrous sulfate 325 (65 FE) MG  tablet, 1 tablet, Disp: , Rfl:    fexofenadine (ALLEGRA) 180 MG tablet, Take 180 mg by mouth daily as needed for allergies. , Disp: , Rfl:    fluconazole (DIFLUCAN) 150 MG tablet, Take 1 tablet every 72 hours by oral route., Disp: , Rfl:    gabapentin (NEURONTIN) 100 MG capsule, Take 1 capsule (100 mg total) by mouth 3 (three) times daily., Disp: 90 capsule, Rfl: 0   hydrochlorothiazide (HYDRODIURIL) 25 MG tablet, Take 25 mg by mouth daily., Disp: , Rfl:    hydrochlorothiazide (HYDRODIURIL) 25 MG tablet, Take 1 tablet (25 mg total) by mouth daily., Disp: 90 tablet, Rfl: 0   HYDROcodone-acetaminophen (NORCO/VICODIN) 5-325 MG tablet, Take 1 tablet by mouth every 6 (six) hours as needed for severe pain., Disp: 15 tablet, Rfl: 0   ipratropium-albuterol (DUONEB) 0.5-2.5 (3) MG/3ML SOLN, Inhale into the lungs., Disp: , Rfl:    Lactobacillus Rhamnosus, GG, (CULTURELLE) CAPS, See administration instructions., Disp: , Rfl:    losartan (COZAAR) 50 MG tablet, Take 50 mg by mouth daily., Disp: , Rfl:    montelukast (SINGULAIR) 10 MG tablet, Take 1 tablet (10 mg total) by mouth in the morning and at bedtime, Disp: 60 tablet, Rfl: 11   Multiple Vitamins-Minerals (MULTIVITAMIN PO), Take 1 tablet by mouth daily., Disp: , Rfl:    naproxen (NAPROSYN) 500 MG tablet,  Take 1 tablet (500 mg total) by mouth 2 (two) times daily with a meal., Disp: 30 tablet, Rfl: 0   pantoprazole (PROTONIX) 40 MG tablet, Take 40 mg by mouth every evening.  , Disp: , Rfl:    phentermine (ADIPEX-P) 37.5 MG tablet, Take 1 tablet (37.5 mg total) by mouth daily before breakfast., Disp: 30 tablet, Rfl: 1   phentermine (ADIPEX-P) 37.5 MG tablet, Take 1 tablet (37.5 mg total) by mouth daily before breakfast for 30 days., Disp: 30 tablet, Rfl: 0   phentermine (ADIPEX-P) 37.5 MG tablet, Take 1 tablet (37.5 mg total) by mouth daily before breakfast., Disp: 30 tablet, Rfl: 0   phentermine (ADIPEX-P) 37.5 MG tablet, Take 1 tablet (37.5 mg total) by  mouth daily before breakfast, Disp: 30 tablet, Rfl: 1   phentermine (ADIPEX-P) 37.5 MG tablet, Take 1 tablet (37.5 mg total) by mouth daily before breakfast., Disp: 30 tablet, Rfl: 1   phentermine (ADIPEX-P) 37.5 MG tablet, Take 1 tablet (37.5 mg total) by mouth daily before breakfast., Disp: 30 tablet, Rfl: 1   phentermine (ADIPEX-P) 37.5 MG tablet, Take 1 tablet (37.5 mg total) by mouth daily before breakfast., Disp: 30 tablet, Rfl: 1   phentermine (ADIPEX-P) 37.5 MG tablet, Take 1 tablet (37.5 mg total) by mouth daily before breakfast., Disp: 30 tablet, Rfl: 0   phentermine 15 MG capsule, Take 1 capsule by mouth once daily for 14 days, Disp: 14 capsule, Rfl: 0   potassium chloride (K-DUR) 10 MEQ tablet, Take 10 mEq by mouth daily., Disp: , Rfl:    potassium chloride SA (KLOR-CON M) 20 MEQ tablet, Take 1 tablet (20 mEq) by mouth daily., Disp: 90 tablet, Rfl: 3   Probiotic Product (PROBIOTIC-10 PO), Take by mouth., Disp: , Rfl:    rosuvastatin (CRESTOR) 10 MG tablet, TAKE 1 TABLET BY MOUTH DAILY, Disp: 90 tablet, Rfl: 1   saccharomyces boulardii (FLORASTOR) 250 MG capsule, 250 mg Orally once a day, Disp: , Rfl:    sodium chloride (OCEAN) 0.65 % SOLN nasal spray, Place 1 spray into both nostrils daily as needed for congestion., Disp: , Rfl:    Tavaborole (KERYDIN) 5 % SOLN, Apply 1 drop topically 1 day or 1 dose. Apply 1 drop to the toenail daily. (Patient not taking: Reported on 04/26/2018), Disp: 1 Bottle, Rfl: 3   topiramate (TOPAMAX) 25 MG tablet, Take 1 tablet (25 mg total) by mouth daily., Disp: 30 tablet, Rfl: 0   topiramate (TOPAMAX) 25 MG tablet, Take 1 tablet (25 mg total) by mouth daily with supper., Disp: 30 tablet, Rfl: 1  Social History   Tobacco Use  Smoking Status Never  Smokeless Tobacco Never    Allergies  Allergen Reactions   Aspirin Shortness Of Breath   Losartan Shortness Of Breath    Other reaction(s): difficulty breathing   Nsaids Swelling    Other Reaction(s):  Other  Other reaction(s): Other   Atorvastatin     Other Reaction(s): Other  Other reaction(s): body aches   Sulfonamide Derivatives Rash   Objective:  There were no vitals filed for this visit. There is no height or weight on file to calculate BMI. Constitutional Well developed. Well nourished.  Vascular Dorsalis pedis pulses palpable bilaterally. Posterior tibial pulses palpable bilaterally. Capillary refill normal to all digits.  No cyanosis or clubbing noted. Pedal hair growth normal.  Neurologic Normal speech. Oriented to person, place, and time. Epicritic sensation to light touch grossly present bilaterally.  Dermatologic No further benign skin lesion noted  to right submetatarsal 3.  Orthopedic: Normal joint ROM without pain or crepitus bilaterally. No visible deformities. No bony tenderness.   Radiographs: None Assessment:   No diagnosis found.  Plan:  Patient was evaluated and treated and all questions answered.  Right submetatarsal 3 benign skin lesion -- Clinically healed officially discharged from my care discussed shoe gear modification if any foot and ankle issues arise in the future he will come back and see me.   No follow-ups on file.

## 2023-04-26 ENCOUNTER — Other Ambulatory Visit (HOSPITAL_COMMUNITY): Payer: Self-pay

## 2023-05-07 ENCOUNTER — Other Ambulatory Visit (HOSPITAL_COMMUNITY): Payer: Self-pay

## 2023-05-14 ENCOUNTER — Other Ambulatory Visit: Payer: Self-pay

## 2023-05-14 ENCOUNTER — Other Ambulatory Visit (HOSPITAL_COMMUNITY): Payer: Self-pay

## 2023-05-14 NOTE — Progress Notes (Signed)
 Specialty Pharmacy Refill Coordination Note  Rachael Calderon is a 53 y.o. female contacted today regarding refills of specialty medication(s) Dupilumab (Dupixent)   Patient requested [name] at [facility] at [place] date: 05/17/23   Medication will be filled on 05/14/23.

## 2023-05-18 ENCOUNTER — Other Ambulatory Visit (HOSPITAL_COMMUNITY): Payer: Self-pay

## 2023-05-18 DIAGNOSIS — I10 Essential (primary) hypertension: Secondary | ICD-10-CM | POA: Diagnosis not present

## 2023-05-18 DIAGNOSIS — R7303 Prediabetes: Secondary | ICD-10-CM | POA: Diagnosis not present

## 2023-05-18 DIAGNOSIS — R5382 Chronic fatigue, unspecified: Secondary | ICD-10-CM | POA: Diagnosis not present

## 2023-05-18 DIAGNOSIS — E66812 Obesity, class 2: Secondary | ICD-10-CM | POA: Diagnosis not present

## 2023-05-18 DIAGNOSIS — F439 Reaction to severe stress, unspecified: Secondary | ICD-10-CM | POA: Diagnosis not present

## 2023-05-18 DIAGNOSIS — Z6836 Body mass index (BMI) 36.0-36.9, adult: Secondary | ICD-10-CM | POA: Diagnosis not present

## 2023-05-18 MED ORDER — PHENTERMINE HCL 37.5 MG PO TABS
37.5000 mg | ORAL_TABLET | Freq: Every day | ORAL | 0 refills | Status: AC
  Filled 2023-05-18: qty 30, 30d supply, fill #0

## 2023-05-22 ENCOUNTER — Other Ambulatory Visit (HOSPITAL_COMMUNITY): Payer: Self-pay

## 2023-06-04 ENCOUNTER — Other Ambulatory Visit: Payer: Self-pay

## 2023-06-08 ENCOUNTER — Other Ambulatory Visit: Payer: Self-pay

## 2023-06-08 NOTE — Progress Notes (Signed)
 Specialty Pharmacy Ongoing Clinical Assessment Note  Rachael Calderon is a 53 y.o. female who is being followed by the specialty pharmacy service for RxSp Asthma/COPD   Patient's specialty medication(s) reviewed today: Dupilumab  (Dupixent )   Missed doses in the last 4 weeks: 0   Patient/Caregiver did not have any additional questions or concerns.   Therapeutic benefit summary: Patient is achieving benefit   Adverse events/side effects summary: No adverse events/side effects   Patient's therapy is appropriate to: Continue    Goals Addressed             This Visit's Progress    Reduce disease symptoms including coughing and shortness of breath   On track    Patient is on track. Patient will maintain adherence         Follow up:  6 months  Sayana Salley M Drayton Tieu Specialty Pharmacist

## 2023-06-08 NOTE — Progress Notes (Signed)
 Specialty Pharmacy Refill Coordination Note  Rachael Calderon is a 53 y.o. female contacted today regarding refills of specialty medication(s) Dupilumab  (Dupixent )   Patient requested Cranston Dk at [facility] at [place] date: 06/15/23   Medication will be filled on 06/14/23.

## 2023-06-16 ENCOUNTER — Other Ambulatory Visit (HOSPITAL_COMMUNITY): Payer: Self-pay

## 2023-06-16 MED ORDER — HYDROCHLOROTHIAZIDE 25 MG PO TABS
25.0000 mg | ORAL_TABLET | Freq: Every morning | ORAL | 0 refills | Status: DC
  Filled 2023-06-16 – 2023-06-29 (×2): qty 90, 90d supply, fill #0

## 2023-06-22 ENCOUNTER — Other Ambulatory Visit (HOSPITAL_COMMUNITY): Payer: Self-pay

## 2023-06-23 ENCOUNTER — Other Ambulatory Visit (HOSPITAL_COMMUNITY): Payer: Self-pay

## 2023-06-23 DIAGNOSIS — E66812 Obesity, class 2: Secondary | ICD-10-CM | POA: Diagnosis not present

## 2023-06-23 DIAGNOSIS — R5382 Chronic fatigue, unspecified: Secondary | ICD-10-CM | POA: Diagnosis not present

## 2023-06-23 DIAGNOSIS — I10 Essential (primary) hypertension: Secondary | ICD-10-CM | POA: Diagnosis not present

## 2023-06-23 DIAGNOSIS — F439 Reaction to severe stress, unspecified: Secondary | ICD-10-CM | POA: Diagnosis not present

## 2023-06-23 DIAGNOSIS — Z6837 Body mass index (BMI) 37.0-37.9, adult: Secondary | ICD-10-CM | POA: Diagnosis not present

## 2023-06-23 DIAGNOSIS — R7303 Prediabetes: Secondary | ICD-10-CM | POA: Diagnosis not present

## 2023-06-23 MED ORDER — PHENTERMINE HCL 37.5 MG PO TABS
37.5000 mg | ORAL_TABLET | Freq: Every day | ORAL | 0 refills | Status: AC
  Filled 2023-06-23: qty 30, 30d supply, fill #0

## 2023-06-28 ENCOUNTER — Other Ambulatory Visit (HOSPITAL_COMMUNITY): Payer: Self-pay

## 2023-06-29 ENCOUNTER — Other Ambulatory Visit (HOSPITAL_COMMUNITY): Payer: Self-pay

## 2023-07-03 ENCOUNTER — Other Ambulatory Visit (HOSPITAL_COMMUNITY): Payer: Self-pay

## 2023-07-05 ENCOUNTER — Other Ambulatory Visit (HOSPITAL_COMMUNITY): Payer: Self-pay

## 2023-07-05 MED ORDER — DILTIAZEM HCL ER 120 MG PO CP24
120.0000 mg | ORAL_CAPSULE | Freq: Every day | ORAL | 1 refills | Status: DC
Start: 2023-07-05 — End: 2024-01-11
  Filled 2023-07-05 – 2023-07-15 (×2): qty 90, 90d supply, fill #0
  Filled 2023-10-11: qty 90, 90d supply, fill #1

## 2023-07-08 ENCOUNTER — Other Ambulatory Visit: Payer: Self-pay

## 2023-07-13 ENCOUNTER — Other Ambulatory Visit: Payer: Self-pay

## 2023-07-14 ENCOUNTER — Other Ambulatory Visit (HOSPITAL_COMMUNITY): Payer: Self-pay

## 2023-07-15 ENCOUNTER — Other Ambulatory Visit (HOSPITAL_COMMUNITY): Payer: Self-pay

## 2023-07-15 DIAGNOSIS — R7303 Prediabetes: Secondary | ICD-10-CM | POA: Diagnosis not present

## 2023-07-15 DIAGNOSIS — J309 Allergic rhinitis, unspecified: Secondary | ICD-10-CM | POA: Diagnosis not present

## 2023-07-15 DIAGNOSIS — J4550 Severe persistent asthma, uncomplicated: Secondary | ICD-10-CM | POA: Diagnosis not present

## 2023-07-15 DIAGNOSIS — I10 Essential (primary) hypertension: Secondary | ICD-10-CM | POA: Diagnosis not present

## 2023-07-15 DIAGNOSIS — E7800 Pure hypercholesterolemia, unspecified: Secondary | ICD-10-CM | POA: Diagnosis not present

## 2023-07-15 DIAGNOSIS — Z0000 Encounter for general adult medical examination without abnormal findings: Secondary | ICD-10-CM | POA: Diagnosis not present

## 2023-07-15 DIAGNOSIS — Z23 Encounter for immunization: Secondary | ICD-10-CM | POA: Diagnosis not present

## 2023-07-16 ENCOUNTER — Other Ambulatory Visit: Payer: Self-pay

## 2023-07-16 NOTE — Progress Notes (Signed)
 Specialty Pharmacy Refill Coordination Note  Rachael Calderon is a 53 y.o. female contacted today regarding refills of specialty medication(s) Dupilumab  (Dupixent )   Patient requested Cranston Dk at [facility] at [place] date: 07/16/23   Medication will be filled on 07/16/23.

## 2023-07-19 ENCOUNTER — Other Ambulatory Visit (HOSPITAL_COMMUNITY): Payer: Self-pay

## 2023-07-26 ENCOUNTER — Other Ambulatory Visit (HOSPITAL_COMMUNITY): Payer: Self-pay

## 2023-07-26 DIAGNOSIS — Z6837 Body mass index (BMI) 37.0-37.9, adult: Secondary | ICD-10-CM | POA: Diagnosis not present

## 2023-07-26 DIAGNOSIS — R7303 Prediabetes: Secondary | ICD-10-CM | POA: Diagnosis not present

## 2023-07-26 DIAGNOSIS — I10 Essential (primary) hypertension: Secondary | ICD-10-CM | POA: Diagnosis not present

## 2023-07-26 DIAGNOSIS — E66812 Obesity, class 2: Secondary | ICD-10-CM | POA: Diagnosis not present

## 2023-07-26 DIAGNOSIS — R5382 Chronic fatigue, unspecified: Secondary | ICD-10-CM | POA: Diagnosis not present

## 2023-07-26 MED ORDER — PHENTERMINE HCL 37.5 MG PO TABS
37.5000 mg | ORAL_TABLET | Freq: Every day | ORAL | 0 refills | Status: DC
  Filled 2023-07-26 – 2023-07-28 (×2): qty 30, 30d supply, fill #0

## 2023-07-27 ENCOUNTER — Other Ambulatory Visit (HOSPITAL_BASED_OUTPATIENT_CLINIC_OR_DEPARTMENT_OTHER): Payer: Self-pay | Admitting: Family Medicine

## 2023-07-27 DIAGNOSIS — E7800 Pure hypercholesterolemia, unspecified: Secondary | ICD-10-CM

## 2023-07-28 ENCOUNTER — Other Ambulatory Visit: Payer: Self-pay

## 2023-07-28 ENCOUNTER — Other Ambulatory Visit (HOSPITAL_COMMUNITY): Payer: Self-pay

## 2023-08-03 ENCOUNTER — Other Ambulatory Visit: Payer: Self-pay

## 2023-08-10 ENCOUNTER — Other Ambulatory Visit: Payer: Self-pay

## 2023-08-10 DIAGNOSIS — E876 Hypokalemia: Secondary | ICD-10-CM | POA: Diagnosis not present

## 2023-08-10 NOTE — Progress Notes (Signed)
 Specialty Pharmacy Refill Coordination Note  Rachael Calderon is a 53 y.o. female contacted today regarding refills of specialty medication(s) Dupilumab  (Dupixent )   Patient requested [name] at [facility] at [place] date: 08/11/23   Medication will be filled on 08/11/23.

## 2023-08-11 ENCOUNTER — Other Ambulatory Visit: Payer: Self-pay

## 2023-08-17 ENCOUNTER — Other Ambulatory Visit (HOSPITAL_COMMUNITY): Payer: Self-pay

## 2023-08-19 ENCOUNTER — Ambulatory Visit (HOSPITAL_BASED_OUTPATIENT_CLINIC_OR_DEPARTMENT_OTHER)
Admission: RE | Admit: 2023-08-19 | Discharge: 2023-08-19 | Disposition: A | Discharge status: Home / Self Care | Payer: Self-pay | Source: Ambulatory Visit | Attending: Family Medicine | Admitting: Family Medicine

## 2023-08-19 DIAGNOSIS — E7800 Pure hypercholesterolemia, unspecified: Secondary | ICD-10-CM | POA: Insufficient documentation

## 2023-08-31 ENCOUNTER — Other Ambulatory Visit: Payer: Self-pay

## 2023-08-31 DIAGNOSIS — J339 Nasal polyp, unspecified: Secondary | ICD-10-CM | POA: Diagnosis not present

## 2023-08-31 DIAGNOSIS — J4550 Severe persistent asthma, uncomplicated: Secondary | ICD-10-CM | POA: Diagnosis not present

## 2023-08-31 DIAGNOSIS — Z886 Allergy status to analgesic agent status: Secondary | ICD-10-CM | POA: Diagnosis not present

## 2023-08-31 DIAGNOSIS — J8283 Eosinophilic asthma: Secondary | ICD-10-CM | POA: Diagnosis not present

## 2023-08-31 DIAGNOSIS — J324 Chronic pansinusitis: Secondary | ICD-10-CM | POA: Diagnosis not present

## 2023-08-31 DIAGNOSIS — J45909 Unspecified asthma, uncomplicated: Secondary | ICD-10-CM | POA: Diagnosis not present

## 2023-09-06 ENCOUNTER — Other Ambulatory Visit (HOSPITAL_COMMUNITY): Payer: Self-pay

## 2023-09-06 DIAGNOSIS — R7303 Prediabetes: Secondary | ICD-10-CM | POA: Diagnosis not present

## 2023-09-06 DIAGNOSIS — R5382 Chronic fatigue, unspecified: Secondary | ICD-10-CM | POA: Diagnosis not present

## 2023-09-06 DIAGNOSIS — I10 Essential (primary) hypertension: Secondary | ICD-10-CM | POA: Diagnosis not present

## 2023-09-06 DIAGNOSIS — E66812 Obesity, class 2: Secondary | ICD-10-CM | POA: Diagnosis not present

## 2023-09-06 DIAGNOSIS — Z6837 Body mass index (BMI) 37.0-37.9, adult: Secondary | ICD-10-CM | POA: Diagnosis not present

## 2023-09-06 MED ORDER — PHENTERMINE HCL 37.5 MG PO TABS
37.5000 mg | ORAL_TABLET | Freq: Every day | ORAL | 0 refills | Status: DC
  Filled 2023-09-06: qty 30, 30d supply, fill #0

## 2023-09-07 ENCOUNTER — Other Ambulatory Visit: Payer: Self-pay | Admitting: Pharmacy Technician

## 2023-09-07 ENCOUNTER — Other Ambulatory Visit (HOSPITAL_COMMUNITY): Payer: Self-pay

## 2023-09-07 ENCOUNTER — Other Ambulatory Visit: Payer: Self-pay

## 2023-09-07 NOTE — Progress Notes (Signed)
 Specialty Pharmacy Refill Coordination Note  Rachael Calderon is a 53 y.o. female contacted today regarding refills of specialty medication(s) Dupilumab  (Dupixent )   Patient requested [name] at [facility] at [place] date: 09/14/23   Medication will be filled on 09/14/23.   This fill date is pending response to refill request from provider. Patient is aware and if they have not received fill by intended date they must follow up with pharmacy.

## 2023-09-10 ENCOUNTER — Other Ambulatory Visit: Payer: Self-pay | Admitting: Pharmacist

## 2023-09-10 ENCOUNTER — Other Ambulatory Visit: Payer: Self-pay

## 2023-09-10 MED ORDER — DUPIXENT 300 MG/2ML ~~LOC~~ SOSY: PREFILLED_SYRINGE | SUBCUTANEOUS | 11 refills | Status: DC

## 2023-09-10 MED ORDER — DUPIXENT 300 MG/2ML ~~LOC~~ SOSY
PREFILLED_SYRINGE | SUBCUTANEOUS | 11 refills | Status: DC
  Filled 2023-09-13: qty 2, 28d supply, fill #0
  Filled 2023-09-13: qty 2, fill #0

## 2023-09-13 ENCOUNTER — Ambulatory Visit: Attending: Family Medicine | Admitting: Pharmacist

## 2023-09-13 ENCOUNTER — Encounter: Payer: Self-pay | Admitting: Pharmacist

## 2023-09-13 ENCOUNTER — Other Ambulatory Visit: Payer: Self-pay

## 2023-09-13 DIAGNOSIS — Z79899 Other long term (current) drug therapy: Secondary | ICD-10-CM

## 2023-09-13 DIAGNOSIS — J45909 Unspecified asthma, uncomplicated: Secondary | ICD-10-CM

## 2023-09-13 MED ORDER — DUPIXENT 300 MG/2ML ~~LOC~~ SOSY
PREFILLED_SYRINGE | SUBCUTANEOUS | 5 refills | Status: DC
  Filled 2023-09-13: qty 4, 28d supply, fill #0
  Filled 2023-10-12 – 2023-10-14 (×2): qty 4, 28d supply, fill #1
  Filled 2023-11-12 – 2023-11-18 (×2): qty 4, 28d supply, fill #2
  Filled 2023-12-17 – 2023-12-24 (×2): qty 4, 28d supply, fill #3
  Filled 2024-01-20 – 2024-01-24 (×2): qty 4, 28d supply, fill #4
  Filled 2024-02-16 – 2024-02-22 (×3): qty 4, 28d supply, fill #5

## 2023-09-13 MED ORDER — DUPIXENT 300 MG/2ML ~~LOC~~ SOSY: PREFILLED_SYRINGE | SUBCUTANEOUS | 5 refills | Status: DC

## 2023-09-13 NOTE — Progress Notes (Signed)
   S: Patient presents for review of their specialty medication therapy.  Patient is currently taking Dupixent for asthma. Patient is managed by Dr. Christell Constant for this.   Adherence: confirms. Has been taking for several years  Efficacy: works well for her.   Dosing: 300 mg q14 days  Dose adjustments: Renal: no dose adjustments (has not been studied) Hepatic: no dose adjustments (has not been studied)  Drug-drug interactions: none  Monitoring: S/sx of infection: none S/sx of hypersensitivity: none S/sx of ocular effects: none S/sx of eosinophilia/vasculitis: none  O:     Lab Results  Component Value Date   WBC 4.7 07/14/2022   HGB 12.8 07/14/2022   HCT 38.1 07/14/2022   MCV 81.9 07/14/2022   PLT 240 07/14/2022      Chemistry      Component Value Date/Time   NA 136 07/14/2022 0826   K 2.9 (L) 07/14/2022 0826   CL 100 07/14/2022 0826   CO2 27 07/14/2022 0826   BUN 14 07/14/2022 0826   CREATININE 1.02 (H) 07/14/2022 0826      Component Value Date/Time   CALCIUM 8.7 (L) 07/14/2022 0826       A/P: 1. Medication review: Patient currently on Dupixent for asthma. Reviewed the medication with the patient, including the following: Dupixent is a monoclonal antibody used for the treatment of asthma or atopic dermatitis. Patient educated on purpose, proper use and potential adverse effects of Dupixent. Possible adverse effects include increased risk of infection, ocular effects, vasculitis/eosinophilia, and hypersensitivity reactions. Administer as a SubQ injection and rotate sites. Allow the medication to reach room temp prior to administration (45 mins for 300 mg syringe or 30 min for 200 mg syringe). Do not shake. Discard any unused portion. No recommendations for any changes.   Butch Penny, PharmD, Patsy [place], CPP Clinical Pharmacist [facility] [phone]

## 2023-09-20 ENCOUNTER — Other Ambulatory Visit (HOSPITAL_COMMUNITY): Payer: Self-pay

## 2023-09-21 ENCOUNTER — Other Ambulatory Visit: Payer: Self-pay

## 2023-09-21 ENCOUNTER — Other Ambulatory Visit (HOSPITAL_COMMUNITY): Payer: Self-pay

## 2023-09-21 MED ORDER — HYDROCHLOROTHIAZIDE 25 MG PO TABS
25.0000 mg | ORAL_TABLET | Freq: Every day | ORAL | 0 refills | Status: DC
  Filled 2023-09-21 – 2023-10-13 (×3): qty 90, 90d supply, fill #0

## 2023-10-01 ENCOUNTER — Other Ambulatory Visit (HOSPITAL_COMMUNITY): Payer: Self-pay

## 2023-10-02 ENCOUNTER — Other Ambulatory Visit (HOSPITAL_COMMUNITY): Payer: Self-pay

## 2023-10-04 ENCOUNTER — Other Ambulatory Visit: Payer: Self-pay

## 2023-10-08 ENCOUNTER — Other Ambulatory Visit (HOSPITAL_COMMUNITY): Payer: Self-pay

## 2023-10-11 ENCOUNTER — Other Ambulatory Visit (HOSPITAL_COMMUNITY): Payer: Self-pay

## 2023-10-11 ENCOUNTER — Other Ambulatory Visit: Payer: Self-pay

## 2023-10-12 ENCOUNTER — Other Ambulatory Visit (HOSPITAL_COMMUNITY): Payer: Self-pay

## 2023-10-12 ENCOUNTER — Other Ambulatory Visit: Payer: Self-pay

## 2023-10-13 ENCOUNTER — Other Ambulatory Visit (HOSPITAL_COMMUNITY): Payer: Self-pay

## 2023-10-13 ENCOUNTER — Other Ambulatory Visit: Payer: Self-pay

## 2023-10-14 ENCOUNTER — Other Ambulatory Visit: Payer: Self-pay

## 2023-10-14 NOTE — Progress Notes (Signed)
 Specialty Pharmacy Refill Coordination Note  Rachael Calderon is a 53 y.o. female contacted today regarding refills of specialty medication(s) Dupilumab  (Dupixent )   Patient requested [name] at [facility] at [place] date: 10/19/23   Medication will be filled on 10/15/23.

## 2023-10-20 ENCOUNTER — Other Ambulatory Visit (HOSPITAL_COMMUNITY): Payer: Self-pay

## 2023-10-20 MED ORDER — PHENTERMINE HCL 37.5 MG PO TABS
37.5000 mg | ORAL_TABLET | Freq: Every day | ORAL | 0 refills | Status: DC
  Filled 2023-10-20: qty 30, 30d supply, fill #0

## 2023-10-22 ENCOUNTER — Other Ambulatory Visit (HOSPITAL_COMMUNITY): Payer: Self-pay

## 2023-10-23 ENCOUNTER — Other Ambulatory Visit (HOSPITAL_COMMUNITY): Payer: Self-pay

## 2023-10-25 ENCOUNTER — Other Ambulatory Visit: Payer: Self-pay

## 2023-11-12 ENCOUNTER — Other Ambulatory Visit (HOSPITAL_COMMUNITY): Payer: Self-pay

## 2023-11-15 DIAGNOSIS — Z23 Encounter for immunization: Secondary | ICD-10-CM | POA: Diagnosis not present

## 2023-11-16 ENCOUNTER — Other Ambulatory Visit: Payer: Self-pay

## 2023-11-18 ENCOUNTER — Other Ambulatory Visit: Payer: Self-pay

## 2023-11-18 NOTE — Progress Notes (Signed)
 Specialty Pharmacy Refill Coordination Note  Rachael Calderon is a 53 y.o. female contacted today regarding refills of specialty medication(s) Dupilumab  (Dupixent )   Patient requested [name] at [facility] at [place] date: 11/18/23   Medication will be filled on 11/18/23.

## 2023-11-22 ENCOUNTER — Other Ambulatory Visit (HOSPITAL_COMMUNITY): Payer: Self-pay

## 2023-11-26 ENCOUNTER — Other Ambulatory Visit (HOSPITAL_COMMUNITY): Payer: Self-pay

## 2023-12-02 ENCOUNTER — Other Ambulatory Visit: Payer: Self-pay

## 2023-12-02 ENCOUNTER — Other Ambulatory Visit (HOSPITAL_COMMUNITY): Payer: Self-pay

## 2023-12-02 DIAGNOSIS — R5382 Chronic fatigue, unspecified: Secondary | ICD-10-CM | POA: Diagnosis not present

## 2023-12-02 DIAGNOSIS — K5903 Drug induced constipation: Secondary | ICD-10-CM | POA: Diagnosis not present

## 2023-12-02 DIAGNOSIS — E66812 Obesity, class 2: Secondary | ICD-10-CM | POA: Diagnosis not present

## 2023-12-02 DIAGNOSIS — R7303 Prediabetes: Secondary | ICD-10-CM | POA: Diagnosis not present

## 2023-12-02 DIAGNOSIS — I10 Essential (primary) hypertension: Secondary | ICD-10-CM | POA: Diagnosis not present

## 2023-12-02 DIAGNOSIS — Z6836 Body mass index (BMI) 36.0-36.9, adult: Secondary | ICD-10-CM | POA: Diagnosis not present

## 2023-12-02 MED ORDER — PHENTERMINE HCL 37.5 MG PO TABS
37.5000 mg | ORAL_TABLET | Freq: Every day | ORAL | 1 refills | Status: AC
  Filled 2023-12-02: qty 30, 30d supply, fill #0

## 2023-12-03 ENCOUNTER — Other Ambulatory Visit (HOSPITAL_COMMUNITY): Payer: Self-pay

## 2023-12-17 ENCOUNTER — Other Ambulatory Visit (HOSPITAL_COMMUNITY): Payer: Self-pay

## 2023-12-21 ENCOUNTER — Other Ambulatory Visit: Payer: Self-pay

## 2023-12-23 ENCOUNTER — Other Ambulatory Visit: Payer: Self-pay

## 2023-12-24 ENCOUNTER — Other Ambulatory Visit: Payer: Self-pay

## 2023-12-24 NOTE — Progress Notes (Signed)
 Specialty Pharmacy Refill Coordination Note  Rachael Calderon is a 53 y.o. female contacted today regarding refills of specialty medication(s) Dupilumab  (Dupixent )   Patient requested [name] at [facility] at [place] date: 12/30/23   Medication will be filled on: 12/29/23

## 2023-12-25 ENCOUNTER — Other Ambulatory Visit (HOSPITAL_COMMUNITY): Payer: Self-pay

## 2023-12-28 ENCOUNTER — Other Ambulatory Visit: Payer: Self-pay

## 2023-12-30 ENCOUNTER — Other Ambulatory Visit (HOSPITAL_COMMUNITY): Payer: Self-pay

## 2024-01-11 ENCOUNTER — Other Ambulatory Visit (HOSPITAL_COMMUNITY): Payer: Self-pay

## 2024-01-11 MED ORDER — DILTIAZEM HCL ER 120 MG PO CP24
120.0000 mg | ORAL_CAPSULE | Freq: Every day | ORAL | 0 refills | Status: AC
  Filled 2024-01-11: qty 90, 90d supply, fill #0

## 2024-01-11 MED ORDER — HYDROCHLOROTHIAZIDE 25 MG PO TABS
25.0000 mg | ORAL_TABLET | Freq: Every day | ORAL | 0 refills | Status: AC
  Filled 2024-01-11: qty 90, 90d supply, fill #0

## 2024-01-14 ENCOUNTER — Other Ambulatory Visit (HOSPITAL_COMMUNITY): Payer: Self-pay

## 2024-01-20 ENCOUNTER — Other Ambulatory Visit: Payer: Self-pay

## 2024-01-21 ENCOUNTER — Other Ambulatory Visit (HOSPITAL_COMMUNITY): Payer: Self-pay

## 2024-01-21 DIAGNOSIS — J0100 Acute maxillary sinusitis, unspecified: Secondary | ICD-10-CM | POA: Diagnosis not present

## 2024-01-21 MED ORDER — AMOXICILLIN-POT CLAVULANATE 875-125 MG PO TABS
1.0000 | ORAL_TABLET | Freq: Two times a day (BID) | ORAL | 0 refills | Status: AC
  Filled 2024-01-21: qty 14, 7d supply, fill #0

## 2024-01-24 ENCOUNTER — Other Ambulatory Visit: Payer: Self-pay

## 2024-01-25 ENCOUNTER — Other Ambulatory Visit: Payer: Self-pay

## 2024-01-25 ENCOUNTER — Other Ambulatory Visit (HOSPITAL_COMMUNITY): Payer: Self-pay

## 2024-01-25 DIAGNOSIS — Z6834 Body mass index (BMI) 34.0-34.9, adult: Secondary | ICD-10-CM | POA: Diagnosis not present

## 2024-01-25 DIAGNOSIS — I10 Essential (primary) hypertension: Secondary | ICD-10-CM | POA: Diagnosis not present

## 2024-01-25 DIAGNOSIS — R7303 Prediabetes: Secondary | ICD-10-CM | POA: Diagnosis not present

## 2024-01-25 DIAGNOSIS — E66811 Obesity, class 1: Secondary | ICD-10-CM | POA: Diagnosis not present

## 2024-01-25 DIAGNOSIS — R5382 Chronic fatigue, unspecified: Secondary | ICD-10-CM | POA: Diagnosis not present

## 2024-01-25 DIAGNOSIS — K5903 Drug induced constipation: Secondary | ICD-10-CM | POA: Diagnosis not present

## 2024-01-25 MED ORDER — PHENTERMINE HCL 37.5 MG PO TABS
37.5000 mg | ORAL_TABLET | Freq: Every day | ORAL | 1 refills | Status: AC
  Filled 2024-01-25: qty 30, 30d supply, fill #0

## 2024-01-25 NOTE — Progress Notes (Signed)
 Specialty Pharmacy Refill Coordination Note  Rachael Calderon is a 53 y.o. female contacted today regarding refills of specialty medication(s) Dupilumab  (Dupixent )   Patient requested [name] at [facility] at [place] date: 01/26/24   Medication will be filled on: 01/26/24

## 2024-01-26 ENCOUNTER — Other Ambulatory Visit: Payer: Self-pay

## 2024-02-16 ENCOUNTER — Other Ambulatory Visit: Payer: Self-pay

## 2024-02-18 ENCOUNTER — Other Ambulatory Visit: Payer: Self-pay

## 2024-02-21 ENCOUNTER — Other Ambulatory Visit: Payer: Self-pay

## 2024-02-22 ENCOUNTER — Other Ambulatory Visit (HOSPITAL_COMMUNITY): Payer: Self-pay

## 2024-02-22 ENCOUNTER — Other Ambulatory Visit: Payer: Self-pay

## 2024-02-22 NOTE — Progress Notes (Signed)
 Specialty Pharmacy Refill Coordination Note  Rachael Calderon is a 54 y.o. female contacted today regarding refills of specialty medication(s) Dupilumab  (Dupixent )   Patient requested [name] at [facility] at [place] date: 02/25/24   Medication will be filled on: 02/24/24

## 2024-02-24 ENCOUNTER — Other Ambulatory Visit: Payer: Self-pay

## 2024-03-09 ENCOUNTER — Other Ambulatory Visit: Payer: Self-pay

## 2024-03-10 ENCOUNTER — Other Ambulatory Visit: Payer: Self-pay

## 2024-03-10 ENCOUNTER — Other Ambulatory Visit: Payer: Self-pay | Admitting: Pharmacist

## 2024-03-10 NOTE — Progress Notes (Signed)
 Specialty Pharmacy Ongoing Clinical Assessment Note  Rachael Calderon is a 54 y.o. female who is being followed by the specialty pharmacy service for RxSp Asthma/COPD   Patient's specialty medication(s) reviewed today: Dupilumab  (Dupixent )   Missed doses in the last 4 weeks: 0   Patient/Caregiver did not have any additional questions or concerns.   Therapeutic benefit summary: Patient is achieving benefit   Adverse events/side effects summary: No adverse events/side effects   Patient's therapy is appropriate to: Continue    Goals Addressed             This Visit's Progress    Reduce disease symptoms including coughing and shortness of breath   On track    Patient is on track. Patient will maintain adherence         Follow up: 12 months  Lyle LELON Chalk Specialty Pharmacist

## 2024-03-13 ENCOUNTER — Other Ambulatory Visit: Payer: Self-pay

## 2024-03-15 ENCOUNTER — Ambulatory Visit: Admitting: Podiatry

## 2024-03-15 DIAGNOSIS — L989 Disorder of the skin and subcutaneous tissue, unspecified: Secondary | ICD-10-CM

## 2024-03-15 DIAGNOSIS — M2041 Other hammer toe(s) (acquired), right foot: Secondary | ICD-10-CM | POA: Diagnosis not present

## 2024-03-15 NOTE — Progress Notes (Unsigned)
 Right fifth distal toe porkeratosis cantherone

## 2024-03-17 ENCOUNTER — Other Ambulatory Visit (HOSPITAL_COMMUNITY): Payer: Self-pay

## 2024-03-20 ENCOUNTER — Other Ambulatory Visit (HOSPITAL_COMMUNITY): Payer: Self-pay

## 2024-03-20 ENCOUNTER — Other Ambulatory Visit: Payer: Self-pay

## 2024-03-21 ENCOUNTER — Other Ambulatory Visit: Payer: Self-pay

## 2024-03-22 ENCOUNTER — Other Ambulatory Visit (HOSPITAL_COMMUNITY): Payer: Self-pay

## 2024-03-22 ENCOUNTER — Other Ambulatory Visit: Payer: Self-pay

## 2024-03-22 ENCOUNTER — Other Ambulatory Visit: Payer: Self-pay | Admitting: Pharmacist

## 2024-03-22 MED ORDER — MONTELUKAST SODIUM 10 MG PO TABS: 10.0000 mg | ORAL_TABLET | Freq: Every morning | ORAL | 11 refills | Status: AC

## 2024-03-22 MED ORDER — PHENTERMINE HCL 37.5 MG PO TABS
37.5000 mg | ORAL_TABLET | Freq: Every day | ORAL | 1 refills | Status: AC
  Filled 2024-03-22: qty 30, 30d supply, fill #0

## 2024-03-22 MED ORDER — DUPIXENT 300 MG/2ML ~~LOC~~ SOSY
PREFILLED_SYRINGE | SUBCUTANEOUS | 5 refills | Status: AC
  Filled 2024-03-22: qty 4, fill #0
  Filled 2024-03-22: qty 4, 28d supply, fill #0

## 2024-03-22 MED ORDER — DUPIXENT 300 MG/2ML ~~LOC~~ SOSY: PREFILLED_SYRINGE | SUBCUTANEOUS | 5 refills | Status: DC

## 2024-03-23 ENCOUNTER — Other Ambulatory Visit: Payer: Self-pay

## 2024-03-23 NOTE — Progress Notes (Signed)
 Specialty Pharmacy Refill Coordination Note  Rachael Calderon is a 54 y.o. female contacted today regarding refills of specialty medication(s) Dupilumab  (Dupixent )   Patient requested [name] at [facility] at [place] date: 03/23/24   Medication will be filled on: 03/23/24

## 2024-03-29 ENCOUNTER — Ambulatory Visit: Admitting: Podiatry
